# Patient Record
Sex: Male | Born: 1960 | Race: Black or African American | Hispanic: No | Marital: Single | Smoking: Never smoker
Health system: Southern US, Community
[De-identification: ages and names within clinical notes are randomized; demographics above are authoritative.]

## PROBLEM LIST (undated history)

## (undated) DIAGNOSIS — I10 Essential (primary) hypertension: Secondary | ICD-10-CM

## (undated) DIAGNOSIS — E119 Type 2 diabetes mellitus without complications: Secondary | ICD-10-CM

## (undated) DIAGNOSIS — E785 Hyperlipidemia, unspecified: Secondary | ICD-10-CM

## (undated) DIAGNOSIS — L509 Urticaria, unspecified: Secondary | ICD-10-CM

## (undated) HISTORY — DX: Urticaria, unspecified: L50.9

---

## 2012-09-30 ENCOUNTER — Emergency Department (HOSPITAL_BASED_OUTPATIENT_CLINIC_OR_DEPARTMENT_OTHER)
Admission: EM | Admit: 2012-09-30 | Discharge: 2012-10-01 | Disposition: A | Discharge status: Home / Self Care | Payer: PRIVATE HEALTH INSURANCE | Attending: Emergency Medicine | Admitting: Emergency Medicine

## 2012-09-30 ENCOUNTER — Encounter (HOSPITAL_BASED_OUTPATIENT_CLINIC_OR_DEPARTMENT_OTHER): Payer: Self-pay | Admitting: *Deleted

## 2012-09-30 DIAGNOSIS — I10 Essential (primary) hypertension: Secondary | ICD-10-CM | POA: Insufficient documentation

## 2012-09-30 DIAGNOSIS — L299 Pruritus, unspecified: Secondary | ICD-10-CM | POA: Insufficient documentation

## 2012-09-30 DIAGNOSIS — E785 Hyperlipidemia, unspecified: Secondary | ICD-10-CM | POA: Insufficient documentation

## 2012-09-30 DIAGNOSIS — L509 Urticaria, unspecified: Secondary | ICD-10-CM | POA: Insufficient documentation

## 2012-09-30 DIAGNOSIS — R21 Rash and other nonspecific skin eruption: Secondary | ICD-10-CM | POA: Insufficient documentation

## 2012-09-30 DIAGNOSIS — Z79899 Other long term (current) drug therapy: Secondary | ICD-10-CM | POA: Insufficient documentation

## 2012-09-30 DIAGNOSIS — E119 Type 2 diabetes mellitus without complications: Secondary | ICD-10-CM | POA: Insufficient documentation

## 2012-09-30 DIAGNOSIS — T7840XA Allergy, unspecified, initial encounter: Secondary | ICD-10-CM

## 2012-09-30 HISTORY — DX: Essential (primary) hypertension: I10

## 2012-09-30 HISTORY — DX: Hyperlipidemia, unspecified: E78.5

## 2012-09-30 HISTORY — DX: Type 2 diabetes mellitus without complications: E11.9

## 2012-09-30 MED ORDER — FAMOTIDINE 20 MG PO TABS: 20.0000 mg | ORAL_TABLET | Freq: Two times a day (BID) | ORAL | Status: DC

## 2012-09-30 NOTE — ED Provider Notes (Signed)
History     CSN: [id number]  Arrival date & time 09/30/12  2152   First MD Initiated Contact with Patient 09/30/12 2302      Chief Complaint  Patient presents with  . Allergic Reaction    (Consider location/radiation/quality/duration/timing/severity/associated sxs/prior treatment) Patient is a 52 y.o. male presenting with allergic reaction. The history is provided by the patient. No language interpreter was used.  Allergic Reaction The primary symptoms are  rash and urticaria. The primary symptoms do not include wheezing, shortness of breath, cough or angioedema. The current episode started more than 2 days ago (at least a year has seen an allergist and they could not find the source). The problem has not changed since onset.This is a chronic problem.  The rash is associated with itching.  The urticaria began more than 2 days ago. The urticaria has been unchanged since its onset. Urticaria is a chronic problem. Urticaria is located on the left arm and right arm. The onset of urticaria was associated with scratching of the skin.  Significant symptoms also include itching. Significant symptoms that are not present include eye redness or rhinorrhea.    Past Medical History  Diagnosis Date  . Hypertension   . Diabetes mellitus without complication   . Hyperlipidemia     History reviewed. No pertinent past surgical history.  No family history on file.  History  Substance Use Topics  . Smoking status: Never Smoker   . Smokeless tobacco: Not on file  . Alcohol Use: No      Review of Systems  HENT: Negative for facial swelling and rhinorrhea.   Eyes: Negative for redness.  Respiratory: Negative for cough, shortness of breath and wheezing.   Skin: Positive for itching and rash.  All other systems reviewed and are negative.    Allergies  Peanuts  Home Medications   Current Outpatient Rx  Name  Route  Sig  Dispense  Refill  . AMLODIPINE BESYLATE 10 MG PO TABS   Oral    Take 10 mg by mouth daily.         . ATORVASTATIN CALCIUM 40 MG PO TABS   Oral   Take 40 mg by mouth daily.         . FEBUXOSTAT 40 MG PO TABS   Oral   Take 40 mg by mouth daily.         Marland Kitchen GLIPIZIDE-METFORMIN HCL 2.5-250 MG PO TABS   Oral   Take 2 tablets by mouth 2 (two) times daily before a meal.         . HYDROXYZINE PAMOATE 25 MG PO CAPS   Oral   Take 25 mg by mouth 4 (four) times daily as needed.         Marland Kitchen LOSARTAN POTASSIUM 100 MG PO TABS   Oral   Take 100 mg by mouth daily.         Marland Kitchen TADALAFIL 5 MG PO TABS   Oral   Take 5 mg by mouth daily as needed.         Marland Kitchen FAMOTIDINE 20 MG PO TABS   Oral   Take 1 tablet (20 mg total) by mouth 2 (two) times daily.   30 tablet   0     BP 139/80  Pulse 95  Temp 98.4 F (36.9 C) (Oral)  Resp 18  Ht 6\' 1"  (1.854 m)  Wt 325 lb (147.419 kg)  BMI 42.88 kg/m2  SpO2 100%  Physical Exam  Constitutional:  He is oriented to person, place, and time. He appears well-developed and well-nourished. No distress.  HENT:  Head: Normocephalic and atraumatic.  Mouth/Throat: Oropharynx is clear and moist.       No swelling of the lips tongue or uvula  Eyes: Conjunctivae normal are normal. Pupils are equal, round, and reactive to light.  Neck: Normal range of motion. Neck supple.  Cardiovascular: Normal rate, regular rhythm and intact distal pulses.   Pulmonary/Chest: Effort normal and breath sounds normal. No stridor. He has no wheezes. He has no rales.  Abdominal: Soft. Bowel sounds are normal. There is no tenderness. There is no rebound and no guarding.  Musculoskeletal: Normal range of motion.  Neurological: He is alert and oriented to person, place, and time.  Skin: Skin is warm and dry. Rash noted.       B forearms urticaria  Psychiatric: Thought content normal.    ED Course  Procedures (including critical care time)  Labs Reviewed - No data to display No results found.   1. Allergic reaction       MDM   Patient states he has steroids at home and states anti histamines do not work.  Refuses all interventions in the ED with nurse Windell Moulding present.  EDP spent 10 minutes or more in counseling of patient with nurse Windell Moulding present.  Patient states he will follow up with his doctor he has decision making capacity to refuse care       Sicily Zaragoza K Alexandrea Westergard-Rasch, MD 09/30/12 2330

## 2012-09-30 NOTE — ED Notes (Signed)
Went to room with MD to discuss tx options in the ED.  Pt declined offer of all available medications given in the ED. Pt also refused the prescriptions offered by MD to get him through until he can follow up with his pmd. Pt stated that he could not take Prednisone any more due to his HTN and DM.  Stated that the other meds do not work. MD was courteous to pt and made offers of tx multiple times and multiple routes and pt just shook his head and declined.  York Spaniel he would just go home.

## 2012-09-30 NOTE — ED Notes (Signed)
Pt left room before receiving DC instructions and rx.  Pt could not be found in ED.

## 2012-09-30 NOTE — ED Notes (Signed)
Pt sts he has had a rash x2 weeks from peanuts. Pt completed course of prednisone x2 and rash returns as soon as he finishes them. Pt sts today he was dizzy.

## 2019-02-14 ENCOUNTER — Inpatient Hospital Stay (HOSPITAL_BASED_OUTPATIENT_CLINIC_OR_DEPARTMENT_OTHER)
Admission: EM | Admit: 2019-02-14 | Discharge: 2019-02-21 | DRG: 177 | Disposition: A | Discharge status: Home / Self Care | Payer: Commercial Managed Care - PPO | Attending: Internal Medicine | Admitting: Internal Medicine

## 2019-02-14 ENCOUNTER — Encounter (HOSPITAL_BASED_OUTPATIENT_CLINIC_OR_DEPARTMENT_OTHER): Payer: Self-pay | Admitting: Emergency Medicine

## 2019-02-14 ENCOUNTER — Other Ambulatory Visit: Payer: Self-pay

## 2019-02-14 ENCOUNTER — Emergency Department (HOSPITAL_BASED_OUTPATIENT_CLINIC_OR_DEPARTMENT_OTHER): Payer: Commercial Managed Care - PPO

## 2019-02-14 DIAGNOSIS — R630 Anorexia: Secondary | ICD-10-CM | POA: Diagnosis present

## 2019-02-14 DIAGNOSIS — Z6841 Body Mass Index (BMI) 40.0 and over, adult: Secondary | ICD-10-CM | POA: Diagnosis not present

## 2019-02-14 DIAGNOSIS — N179 Acute kidney failure, unspecified: Secondary | ICD-10-CM | POA: Diagnosis present

## 2019-02-14 DIAGNOSIS — L501 Idiopathic urticaria: Secondary | ICD-10-CM | POA: Diagnosis present

## 2019-02-14 DIAGNOSIS — Z79899 Other long term (current) drug therapy: Secondary | ICD-10-CM

## 2019-02-14 DIAGNOSIS — U071 COVID-19: Principal | ICD-10-CM | POA: Diagnosis present

## 2019-02-14 DIAGNOSIS — R300 Dysuria: Secondary | ICD-10-CM | POA: Diagnosis present

## 2019-02-14 DIAGNOSIS — E119 Type 2 diabetes mellitus without complications: Secondary | ICD-10-CM | POA: Diagnosis present

## 2019-02-14 DIAGNOSIS — E861 Hypovolemia: Secondary | ICD-10-CM | POA: Diagnosis present

## 2019-02-14 DIAGNOSIS — I10 Essential (primary) hypertension: Secondary | ICD-10-CM | POA: Diagnosis not present

## 2019-02-14 DIAGNOSIS — Z91010 Allergy to peanuts: Secondary | ICD-10-CM

## 2019-02-14 DIAGNOSIS — K529 Noninfective gastroenteritis and colitis, unspecified: Secondary | ICD-10-CM | POA: Diagnosis present

## 2019-02-14 DIAGNOSIS — J1289 Other viral pneumonia: Secondary | ICD-10-CM | POA: Diagnosis present

## 2019-02-14 DIAGNOSIS — E785 Hyperlipidemia, unspecified: Secondary | ICD-10-CM | POA: Diagnosis present

## 2019-02-14 DIAGNOSIS — A0839 Other viral enteritis: Secondary | ICD-10-CM | POA: Diagnosis present

## 2019-02-14 DIAGNOSIS — I959 Hypotension, unspecified: Secondary | ICD-10-CM | POA: Diagnosis present

## 2019-02-14 DIAGNOSIS — K219 Gastro-esophageal reflux disease without esophagitis: Secondary | ICD-10-CM | POA: Diagnosis present

## 2019-02-14 DIAGNOSIS — E860 Dehydration: Secondary | ICD-10-CM | POA: Diagnosis present

## 2019-02-14 DIAGNOSIS — E871 Hypo-osmolality and hyponatremia: Secondary | ICD-10-CM | POA: Diagnosis present

## 2019-02-14 DIAGNOSIS — A419 Sepsis, unspecified organism: Secondary | ICD-10-CM | POA: Diagnosis present

## 2019-02-14 DIAGNOSIS — J9601 Acute respiratory failure with hypoxia: Secondary | ICD-10-CM | POA: Diagnosis present

## 2019-02-14 DIAGNOSIS — R579 Shock, unspecified: Secondary | ICD-10-CM

## 2019-02-14 LAB — GLUCOSE, CAPILLARY
Glucose-Capillary: 158 mg/dL — ABNORMAL HIGH (ref 70–99)
Glucose-Capillary: 164 mg/dL — ABNORMAL HIGH (ref 70–99)

## 2019-02-14 LAB — CBC WITH DIFFERENTIAL/PLATELET
Abs Immature Granulocytes: 0.14 10*3/uL — ABNORMAL HIGH (ref 0.00–0.07)
Basophils Absolute: 0 10*3/uL (ref 0.0–0.1)
Basophils Relative: 0 %
Eosinophils Absolute: 0 10*3/uL (ref 0.0–0.5)
Eosinophils Relative: 0 %
HCT: 45 % (ref 39.0–52.0)
Hemoglobin: 13.9 g/dL (ref 13.0–17.0)
Immature Granulocytes: 1 %
Lymphocytes Relative: 4 %
Lymphs Abs: 0.5 10*3/uL — ABNORMAL LOW (ref 0.7–4.0)
MCH: 26.3 pg (ref 26.0–34.0)
MCHC: 30.9 g/dL (ref 30.0–36.0)
MCV: 85.1 fL (ref 80.0–100.0)
Monocytes Absolute: 1.1 10*3/uL — ABNORMAL HIGH (ref 0.1–1.0)
Monocytes Relative: 8 %
Neutro Abs: 12.4 10*3/uL — ABNORMAL HIGH (ref 1.7–7.7)
Neutrophils Relative %: 87 %
Platelets: 205 10*3/uL (ref 150–400)
RBC: 5.29 MIL/uL (ref 4.22–5.81)
RDW: 16.1 % — ABNORMAL HIGH (ref 11.5–15.5)
WBC: 14.2 10*3/uL — ABNORMAL HIGH (ref 4.0–10.5)
nRBC: 0 % (ref 0.0–0.2)

## 2019-02-14 LAB — BASIC METABOLIC PANEL
Anion gap: 12 (ref 5–15)
BUN: 27 mg/dL — ABNORMAL HIGH (ref 6–20)
CO2: 20 mmol/L — ABNORMAL LOW (ref 22–32)
Calcium: 9 mg/dL (ref 8.9–10.3)
Chloride: 101 mmol/L (ref 98–111)
Creatinine, Ser: 2.49 mg/dL — ABNORMAL HIGH (ref 0.61–1.24)
GFR calc Af Amer: 32 mL/min — ABNORMAL LOW (ref >60)
GFR calc non Af Amer: 27 mL/min — ABNORMAL LOW (ref >60)
Glucose, Bld: 237 mg/dL — ABNORMAL HIGH (ref 70–99)
Potassium: 4.2 mmol/L (ref 3.5–5.1)
Sodium: 133 mmol/L — ABNORMAL LOW (ref 135–145)

## 2019-02-14 LAB — LACTIC ACID, PLASMA: Lactic Acid, Venous: 2.8 mmol/L (ref 0.5–1.9)

## 2019-02-14 LAB — URINALYSIS, ROUTINE W REFLEX MICROSCOPIC
Bilirubin Urine: NEGATIVE
Glucose, UA: NEGATIVE mg/dL
Hgb urine dipstick: NEGATIVE
Ketones, ur: NEGATIVE mg/dL
Leukocytes,Ua: NEGATIVE
Nitrite: NEGATIVE
Protein, ur: NEGATIVE mg/dL
Specific Gravity, Urine: <1.005 — ABNORMAL LOW (ref 1.005–1.030)
pH: 5.5 (ref 5.0–8.0)

## 2019-02-14 LAB — SARS CORONAVIRUS 2 AG (30 MIN TAT): SARS Coronavirus 2 Ag: POSITIVE — AB

## 2019-02-14 MED ORDER — ONDANSETRON HCL 4 MG PO TABS
4.0000 mg | ORAL_TABLET | Freq: Four times a day (QID) | ORAL | Status: DC | PRN
  Administered 2019-02-16: 21:00:00 4 mg via ORAL
  Filled 2019-02-14: qty 1

## 2019-02-14 MED ORDER — LOPERAMIDE HCL 2 MG PO CAPS
2.0000 mg | ORAL_CAPSULE | ORAL | Status: DC | PRN
  Administered 2019-02-16: 2 mg via ORAL
  Filled 2019-02-14: qty 1

## 2019-02-14 MED ORDER — SODIUM CHLORIDE 0.9 % IV BOLUS
3000.0000 mL | Freq: Once | INTRAVENOUS | Status: AC
  Administered 2019-02-14: 3000 mL via INTRAVENOUS

## 2019-02-14 MED ORDER — SODIUM CHLORIDE 0.9 % IV SOLN
2.0000 g | Freq: Once | INTRAVENOUS | Status: AC
  Administered 2019-02-14: 2 g via INTRAVENOUS
  Filled 2019-02-14: qty 2

## 2019-02-14 MED ORDER — ONDANSETRON HCL 4 MG/2ML IJ SOLN: 4.0000 mg | Freq: Four times a day (QID) | INTRAMUSCULAR | Status: DC | PRN

## 2019-02-14 MED ORDER — HEPARIN SODIUM (PORCINE) 5000 UNIT/ML IJ SOLN
5000.0000 [IU] | Freq: Three times a day (TID) | INTRAMUSCULAR | Status: DC
  Administered 2019-02-14 – 2019-02-15 (×4): 5000 [IU] via SUBCUTANEOUS
  Filled 2019-02-14 (×4): qty 1

## 2019-02-14 MED ORDER — VANCOMYCIN HCL IN DEXTROSE 1-5 GM/200ML-% IV SOLN
1000.0000 mg | INTRAVENOUS | Status: AC
  Administered 2019-02-14 (×2): 1000 mg via INTRAVENOUS
  Filled 2019-02-14: qty 200

## 2019-02-14 MED ORDER — LACTATED RINGERS IV BOLUS
1000.0000 mL | Freq: Once | INTRAVENOUS | Status: AC
  Administered 2019-02-14: 1000 mL via INTRAVENOUS

## 2019-02-14 MED ORDER — HYDROXYZINE PAMOATE 25 MG PO CAPS
25.0000 mg | ORAL_CAPSULE | ORAL | Status: DC | PRN
  Filled 2019-02-14: qty 1

## 2019-02-14 MED ORDER — TRAMADOL HCL 50 MG PO TABS
50.0000 mg | ORAL_TABLET | Freq: Two times a day (BID) | ORAL | Status: DC | PRN
  Administered 2019-02-15 – 2019-02-16 (×3): 50 mg via ORAL
  Filled 2019-02-14 (×3): qty 1

## 2019-02-14 MED ORDER — FAMOTIDINE 20 MG PO TABS
20.0000 mg | ORAL_TABLET | Freq: Two times a day (BID) | ORAL | Status: DC
  Administered 2019-02-14 – 2019-02-21 (×14): 20 mg via ORAL
  Filled 2019-02-14 (×15): qty 1

## 2019-02-14 MED ORDER — INSULIN ASPART 100 UNIT/ML ~~LOC~~ SOLN
0.0000 [IU] | Freq: Three times a day (TID) | SUBCUTANEOUS | Status: DC
  Administered 2019-02-15: 12:00:00 5 [IU] via SUBCUTANEOUS
  Administered 2019-02-15: 3 [IU] via SUBCUTANEOUS

## 2019-02-14 MED ORDER — VANCOMYCIN HCL IN DEXTROSE 1-5 GM/200ML-% IV SOLN
INTRAVENOUS | Status: AC
  Administered 2019-02-14: 1000 mg via INTRAVENOUS
  Filled 2019-02-14: qty 200

## 2019-02-14 MED ORDER — SODIUM CHLORIDE 0.9 % IV SOLN
2000.0000 mg | Freq: Once | INTRAVENOUS | Status: DC
  Filled 2019-02-14: qty 2000

## 2019-02-14 MED ORDER — BISACODYL 10 MG RE SUPP: 10.0000 mg | Freq: Every day | RECTAL | Status: DC | PRN

## 2019-02-14 MED ORDER — SENNOSIDES-DOCUSATE SODIUM 8.6-50 MG PO TABS: 1.0000 | ORAL_TABLET | Freq: Every evening | ORAL | Status: DC | PRN

## 2019-02-14 MED ORDER — ACETAMINOPHEN 325 MG PO TABS
650.0000 mg | ORAL_TABLET | Freq: Four times a day (QID) | ORAL | Status: DC | PRN
  Administered 2019-02-15 – 2019-02-20 (×5): 650 mg via ORAL
  Filled 2019-02-14 (×5): qty 2

## 2019-02-14 MED ORDER — SODIUM CHLORIDE 0.9 % IV SOLN
INTRAVENOUS | Status: DC
  Administered 2019-02-14 – 2019-02-19 (×5): via INTRAVENOUS

## 2019-02-14 MED ORDER — CEFEPIME HCL 2 G IJ SOLR
INTRAMUSCULAR | Status: AC
  Filled 2019-02-14: qty 2

## 2019-02-14 MED ORDER — INSULIN ASPART 100 UNIT/ML ~~LOC~~ SOLN: 0.0000 [IU] | Freq: Every day | SUBCUTANEOUS | Status: DC

## 2019-02-14 NOTE — ED Provider Notes (Signed)
[facility] EMERGENCY DEPARTMENT Provider Note   CSN: [id number] Arrival date & time: 02/14/19  [id number]     History   Chief Complaint Chief Complaint  Patient presents with  . Fever    HPI Grant Rutshurman Oesterle is a 58 y.o. male.  HPI   57yM with fever and body aches. Onset Saturday. Constant and progressive since then. Feels very fatigued. Body aches. Urinating frequently and some dysuria. Not really having any abdominal pain. Nausea. No v/d. Occasional cough but doesn't feel SOB.   Past Medical History:  Diagnosis Date  . Diabetes mellitus without complication (HCC)   . Hyperlipidemia   . Hypertension     There are no active problems to display for this patient.   History reviewed. No pertinent surgical history.      Home Medications    Prior to Admission medications   Medication Sig Start Date End Date Taking? Authorizing Provider  amLODipine (NORVASC) 10 MG tablet Take 10 mg by mouth daily.    [provider]  atorvastatin (LIPITOR) 40 MG tablet Take 40 mg by mouth daily.    [provider]  famotidine (PEPCID) 20 MG tablet Take 1 tablet (20 mg total) by mouth 2 (two) times daily. 09/30/12   Palumbo, April, MD  febuxostat (ULORIC) 40 MG tablet Take 40 mg by mouth daily.    [provider]  glipizide-metformin (METAGLIP) 2.5-250 MG per tablet Take 2 tablets by mouth 2 (two) times daily before a meal.    [provider]  hydrOXYzine (VISTARIL) 25 MG capsule Take 25 mg by mouth 4 (four) times daily as needed.    [provider]  losartan (COZAAR) 100 MG tablet Take 100 mg by mouth daily.    [provider]  tadalafil (CIALIS) 5 MG tablet Take 5 mg by mouth daily as needed.    [provider]    Family History History reviewed. No pertinent family history.  Social History Social History   Tobacco Use  . Smoking status: Never Smoker  . Smokeless tobacco: Never Used  Substance Use Topics  . Alcohol  use: No  . Drug use: Not on file     Allergies   Peanuts [peanut oil]   Review of Systems Review of Systems All systems reviewed and negative, other than as noted in HPI.   Physical Exam Updated Vital Signs BP 119/88 (BP Location: Right Arm)   Pulse 98   Temp 99.9 F (37.7 C) (Oral)   Resp 20   Ht 6\' 1"  (1.854 m)   Wt (!) 154.2 kg   SpO2 94%   BMI 44.86 kg/m   Physical Exam Vitals signs and nursing note reviewed.  Constitutional:      General: He is not in acute distress.    Appearance: He is well-developed. He is obese.     Comments: Laying in bed. Appears tired but not toxic/distressed.   HENT:     Head: Normocephalic and atraumatic.  Eyes:     General:        Right eye: No discharge.        Left eye: No discharge.     Conjunctiva/sclera: Conjunctivae normal.  Neck:     Musculoskeletal: Neck supple.  Cardiovascular:     Rate and Rhythm: Normal rate and regular rhythm.     Heart sounds: Normal heart sounds. No murmur. No friction rub. No gallop.   Pulmonary:     Effort: Pulmonary effort is normal. No respiratory distress.  Breath sounds: Normal breath sounds.  Abdominal:     General: There is no distension.     Palpations: Abdomen is soft.     Tenderness: There is no abdominal tenderness.  Musculoskeletal:        General: No tenderness.  Skin:    General: Skin is warm and dry.  Neurological:     General: No focal deficit present.     Mental Status: He is alert and oriented to person, place, and time.  Psychiatric:        Behavior: Behavior normal.        Thought Content: Thought content normal.      ED Treatments / Results  Labs (all labs ordered are listed, but only abnormal results are displayed) Labs Reviewed  SARS CORONAVIRUS 2 (HOSP ORDER, PERFORMED IN [facility] VIA [facility] ID) - Abnormal; Notable for the following components:      Result Value   SARS Coronavirus 2 ([facility] ID Now) POSITIVE (*)    All other components within normal  limits  CBC WITH DIFFERENTIAL/PLATELET - Abnormal; Notable for the following components:   WBC 14.2 (*)    RDW 16.1 (*)    Neutro Abs 12.4 (*)    Lymphs Abs 0.5 (*)    Monocytes Absolute 1.1 (*)    Abs Immature Granulocytes 0.14 (*)    All other components within normal limits  BASIC METABOLIC PANEL - Abnormal; Notable for the following components:   Sodium 133 (*)    CO2 20 (*)    Glucose, Bld 237 (*)    BUN 27 (*)    Creatinine, Ser 2.49 (*)    GFR calc non Af Amer 27 (*)    GFR calc Af Amer 32 (*)    All other components within normal limits  LACTIC ACID, PLASMA - Abnormal; Notable for the following components:   Lactic Acid, Venous 2.8 (*)    All other components within normal limits  NOVEL CORONAVIRUS, NAA (HOSPITAL ORDER, SEND-OUT TO REF LAB)  CULTURE, BLOOD (ROUTINE X 2)  CULTURE, BLOOD (ROUTINE X 2)  URINALYSIS, ROUTINE W REFLEX MICROSCOPIC  LACTIC ACID, PLASMA    EKG EKG Interpretation  Date/Time:  Monday February 14 2019 13:52:12 EDT Ventricular Rate:  85 PR Interval:    QRS Duration: 124 QT Interval:  368 QTC Calculation: 438 R Axis:   -70 Text Interpretation:  Sinus rhythm Nonspecific IVCD with LAD Left ventricular hypertrophy Reconfirmed by Raeford RazorKohut, Kirstine Jacquin [phone]([id number]) on 02/14/2019 2:27:29 PM   Radiology Dg Chest Portable 1 View  Result Date: 02/14/2019 CLINICAL DATA:  Cough, weakness for 2 days EXAM: PORTABLE CHEST 1 VIEW COMPARISON:  None. FINDINGS: The heart size and mediastinal contours are within normal limits. Both lungs are clear. The visualized skeletal structures are unremarkable. IMPRESSION: No active disease. Electronically Signed   By: Elige KoHetal  Patel   On: 02/14/2019 10:50    Procedures Procedures (including critical care time)  CRITICAL CARE Performed by: Raeford RazorStephen Jullian Previti Total critical care time: 35 minutes Critical care time was exclusive of separately billable procedures and treating other patients. Critical care was necessary to treat or prevent  imminent or life-threatening deterioration. Critical care was time spent personally by me on the following activities: development of treatment plan with patient and/or surrogate as well as nursing, discussions with consultants, evaluation of patient's response to treatment, examination of patient, obtaining history from patient or surrogate, ordering and performing treatments and interventions, ordering and review of laboratory studies, ordering and review  of radiographic studies, pulse oximetry and re-evaluation of patient's condition.   Medications Ordered in ED Medications  vancomycin (VANCOCIN) IVPB 1000 mg/200 mL premix (1,000 mg Intravenous New Bag/Given 02/14/19 1448)  lactated ringers bolus 1,000 mL (0 mLs Intravenous Stopped 02/14/19 1420)  sodium chloride 0.9 % bolus 3,000 mL (3,000 mLs Intravenous New Bag/Given 02/14/19 1342)  ceFEPIme (MAXIPIME) 2 g in sodium chloride 0.9 % 100 mL IVPB (0 g Intravenous Stopped 02/14/19 1447)  ceFEPIme (MAXIPIME) 2 g injection (  Return to [facility] 02/14/19 1348)     Initial Impression / Assessment and Plan / ED Course  I have reviewed the triage vital signs and the nursing notes.  Pertinent labs & imaging results that were available during my care of the patient were reviewed by me and considered in my medical decision making (see chart for details).   57yM with sepsis/COVID. Presented normotensive but subsequently dropped BP. Bolused IVF and empiric abx ordered.  COVID subsequently came back positive. He has minimal respiratory complaints. CXR clear. 02 sats normal on RA.    Tameem Leuthold was evaluated in Emergency Department on 02/14/2019 for the symptoms described in the history of present illness. He was evaluated in the context of the global COVID-19 pandemic, which necessitated consideration that the patient might be at risk for infection with the SARS-CoV-2 virus that causes COVID-19. Institutional protocols and algorithms that pertain to the  evaluation of patients at risk for COVID-19 are in a state of rapid change based on information released by regulatory bodies including the CDC and federal and state organizations. These policies and algorithms were followed during the patient's care in the ED.   Final Clinical Impressions(s) / ED Diagnoses   Final diagnoses:  COVID-19 virus infection  Sepsis, due to unspecified organism, unspecified whether acute organ dysfunction present [facility])    ED Discharge Orders    None       Virgel Manifold, MD 02/14/19 1525

## 2019-02-14 NOTE — ED Triage Notes (Signed)
Pt reports cough, fever, nausea, dizzyness, and fatigue x Saturday. Several coworkers at his job have tested + for covid.

## 2019-02-14 NOTE — H&P (Signed)
History and Physical    Marcus Fritz Boeding ZOX:[id number] DOB: [birth year] DOA: 02/14/2019  [facility] [place]   [facility]  Chief Complaint: fatigue / myalgia  HPI: 58yo w/ a hx of DM, HTN, and Obesity who [facility] w/ c/o 2-3 days of generalized fatigue, subjective fevers, diarrhea, and myalgia.   In the ED he was found to be hypotensive into the 60s systolic, and suffering w/ acute renal failure w/ a crt of 2.49 (baseline 1.0 earlier this month). His BP improved w/ volume, but he remained very weak. He was also incidentally noted to be SARS-CoV-2+, though he denies SOB, cough, abdom pain, or diarrhea.    Assessment/Plan  COVID Gastroenteritis  No evidence of pulmonary issues presently - watch carefully as pt is hydrated - repeat CXR in AM - hydrate - imodium - encourage diet   Hypotension - Severe Dehydration  Due to fever and GI losses - hydrate and follow - responding well already   Acute renal failure  Likely simple pre-renal azotemia worsened by ongoing use of ARB - stop ARB - hydrate - f/u in AM  Hyponatremia  Due to hypovolemia - follow w/ volume expansion   HTN Not an active issue presently due [facility]  HLD Hold Lipitor until rehydrated and intake improved   DM2 A1c 7.8 11/08/18  Chronic Intermittent Idiopathic Urticaria Followed in outpt setting - has seen a Dermatologist w/ no etiology found   Morbid Obesity - Body mass index is 44.86 kg/m.   DVT prophylaxis: SQ heparin  Code Status: FULL Family Communication: none at point of admit   [facility]   Review of Systems: As per HPI otherwise 10 point review of systems negative.   Past Medical History:  Diagnosis Date  . Diabetes mellitus without complication (HCC)   . Hyperlipidemia   . Hypertension     History reviewed. No pertinent surgical history.  Family History  History reviewed. No pertinent family history.   Social History   reports that he has never smoked. He has never used smokeless tobacco. He reports that he does not drink alcohol. No history on file for drug.  Allergies Allergies  Allergen Reactions  . Peanuts [Peanut Oil] Rash    Prior to Admission medications   Medication Sig Start Date End Date Taking? Authorizing Provider  amLODipine (NORVASC) 10 MG tablet Take 10 mg by mouth daily.    [provider]  atorvastatin (LIPITOR) 40 MG tablet Take 40 mg by mouth daily.    [provider]  famotidine (PEPCID) 20 MG tablet Take 1 tablet (20 mg total) by mouth 2 (two) times daily. 09/30/12   Palumbo, April, MD  febuxostat (ULORIC) 40 MG tablet Take 40 mg by mouth daily.    [provider]  glipizide-metformin (METAGLIP) 2.5-250 MG per tablet Take 2 tablets by mouth 2 (two) times daily before a meal.    [provider]  hydrOXYzine (VISTARIL) 25 MG capsule Take 25 mg by mouth 4 (four) times daily as needed.    [provider]  losartan (COZAAR) 100 MG tablet Take 100 mg by mouth daily.    [provider]  tadalafil (CIALIS) 5 MG tablet Take 5 mg by mouth daily as needed.    [provider]    Physical Exam: Vitals:   02/14/19 1445 02/14/19 1500 02/14/19 1533 02/14/19 1615  BP: (!) 110/58 122/75 99/86 (!) 118/58  Pulse: 83 81 (!) 38 89  Resp: (!) 23 (!) 24 16 (!) 24  Temp:      TempSrc:      SpO2: 97% 96% 95% 98%  Weight:      Height:        Constitutional: NAD, calm, comfortable ENMT: Mucous membranes are moist. Posterior pharynx clear of any exudate or lesions.Normal dentition.  Neck: normal, supple, no masses, no thyromegaly Respiratory: clear to auscultation bilaterally, no wheezing, no crackles. Normal respiratory effort. No accessory muscle use.  Cardiovascular: Regular rate and rhythm, no murmurs / rubs / gallops. No extremity edema. 2+ pedal pulses. No carotid bruits.  Abdomen: no tenderness, obese, no masses  palpated. Bowel sounds positive.  Musculoskeletal: no clubbing / cyanosis. No joint deformity upper and lower extremities. Good ROM, no contractures. Normal muscle tone.  Neurologic: CN 2-12 grossly intact. Sensation intact, DTR normal. Strength 5/5 in all 4.  Psychiatric: Normal judgment and insight. Alert and oriented x 3. Normal mood.    Labs on Admission:   CBC: Recent Labs  Lab 02/14/19 1323  WBC 14.2*  NEUTROABS 12.4*  HGB 13.9  HCT 45.0  MCV 85.1  PLT 205   Basic Metabolic Panel: Recent Labs  Lab 02/14/19 1323  NA 133*  K 4.2  CL 101  CO2 20*  GLUCOSE 237*  BUN 27*  CREATININE 2.49*  CALCIUM 9.0   GFR: Estimated Creatinine Clearance: 50.1 mL/min (A) (by C-G formula based on SCr of 2.49 mg/dL (H)).  Urine analysis:    Component Value Date/Time   COLORURINE YELLOW 02/14/2019 1540   APPEARANCEUR CLEAR 02/14/2019 1540   LABSPEC <1.005 (L) 02/14/2019 1540   PHURINE 5.5 02/14/2019 1540   GLUCOSEU NEGATIVE 02/14/2019 1540   HGBUR NEGATIVE 02/14/2019 1540   BILIRUBINUR NEGATIVE 02/14/2019 1540   KETONESUR NEGATIVE 02/14/2019 1540   PROTEINUR NEGATIVE 02/14/2019 1540   NITRITE NEGATIVE 02/14/2019 1540   LEUKOCYTESUR NEGATIVE 02/14/2019 1540    Recent Results (from the past 240 hour(s))  SARS Coronavirus 2 (Hosp order,[facility] via [facility] ID)     Status: Abnormal   Collection Time: 02/14/19  1:53 PM   Specimen: Dry Nasal Swab ([facility] ID Now)  Result Value Ref Range Status   SARS Coronavirus 2 ([facility]bbott ID Now) POSITIVE (A) NEGATIVE Final    Comment: RESULT CALLED TO, READ BACK BY AND VERIFIED WITH: AMY HARTLEY RN @1416  02/14/2019 OLSONM (NOTE) Interpretive Result Comment(s): COVID 19 Positive SARS CoV 2 target nucleic acids are DETECTED. The SARS CoV 2 RNA is generally detectable in upper and lower respiratory specimens during the acute phase of infection.  Positive results are indicative of active infection with SARS CoV 2.  Clinical  correlation with patient history and other diagnostic information is necessary to determine patient infection status.  Positive results do not rule out bacterial infection or coinfection with other viruses. The expected result is Negative. COVID 19 Negative SARS CoV 2 target nucleic acids are NOT DETECTED. The SARS CoV 2 RNA is generally detectable in upper and lower respiratory specimens during the acute phase of infection.  Negative results do not preclude SARS CoV 2 infection, do not rule out coinfections with other pathogens, and should not be used as the sole basis for treatme nt or other patient management decisions.  Negative results must be combined with clinical observations, patient history, and epidemiological information. The expected result is Negative. Invalid Presence or absence of SARS CoV 2 nucleic acids cannot be determined.  Repeat testing was performed on the submitted specimen and repeated Invalid results were obtained.  If clinically indicated, additional testing on a new specimen with an alternate test methodology [phone]) is advised.  The SARS CoV 2 RNA is generally detectable in upper and lower respiratory specimens during the acute phase of infection. The expected result is Negative. Fact Sheet for Patients:  GolfingFamily.no Fact Sheet for Healthcare Providers: https://www.hernandez-brewer.com/ This test is not yet approved or cleared by the [place] FDA and has been authorized for detection and/or diagnosis of SARS CoV 2 by FDA under an Emergency Use Authorization (EUA).  This EU A will remain in effect (meaning this test can be used) for the duration of the COVID19 declaration under Section 564(b)(1) of the Act, 21 U.S.C. section [phone] 3(b)(1), unless the authorization is terminated or revoked sooner. Performed at [facility], [address] [place], [place] [address]      Radiological Exams on  Admission: Dg Chest Portable 1 View  Result Date: 02/14/2019 CLINICAL DATA:  Cough, weakness for 2 days EXAM: PORTABLE CHEST 1 VIEW COMPARISON:  None. FINDINGS: The heart size and mediastinal contours are within normal limits. Both lungs are clear. The visualized skeletal structures are unremarkable. IMPRESSION: No active disease. Electronically Signed   By: Kathreen Devoid   On: 02/14/2019 10:50    Cherene Altes, MD [facility] Office  [phone]  02/14/2019, 4:53 PM

## 2019-02-15 ENCOUNTER — Inpatient Hospital Stay (HOSPITAL_COMMUNITY): Payer: Commercial Managed Care - PPO

## 2019-02-15 DIAGNOSIS — J1289 Other viral pneumonia: Secondary | ICD-10-CM

## 2019-02-15 LAB — CBC WITH DIFFERENTIAL/PLATELET
Abs Immature Granulocytes: 0.05 10*3/uL (ref 0.00–0.07)
Basophils Absolute: 0 10*3/uL (ref 0.0–0.1)
Basophils Relative: 0 %
Eosinophils Absolute: 0 10*3/uL (ref 0.0–0.5)
Eosinophils Relative: 0 %
HCT: 44.6 % (ref 39.0–52.0)
Hemoglobin: 13.5 g/dL (ref 13.0–17.0)
Immature Granulocytes: 1 %
Lymphocytes Relative: 7 %
Lymphs Abs: 0.7 10*3/uL (ref 0.7–4.0)
MCH: 26.1 pg (ref 26.0–34.0)
MCHC: 30.3 g/dL (ref 30.0–36.0)
MCV: 86.1 fL (ref 80.0–100.0)
Monocytes Absolute: 0.4 10*3/uL (ref 0.1–1.0)
Monocytes Relative: 5 %
Neutro Abs: 7.6 10*3/uL (ref 1.7–7.7)
Neutrophils Relative %: 87 %
Platelets: 210 10*3/uL (ref 150–400)
RBC: 5.18 MIL/uL (ref 4.22–5.81)
RDW: 16.2 % — ABNORMAL HIGH (ref 11.5–15.5)
WBC: 8.8 10*3/uL (ref 4.0–10.5)
nRBC: 0 % (ref 0.0–0.2)

## 2019-02-15 LAB — COMPREHENSIVE METABOLIC PANEL
ALT: 30 U/L (ref 0–44)
AST: 21 U/L (ref 15–41)
Albumin: 3.7 g/dL (ref 3.5–5.0)
Alkaline Phosphatase: 34 U/L — ABNORMAL LOW (ref 38–126)
Anion gap: 13 (ref 5–15)
BUN: 18 mg/dL (ref 6–20)
CO2: 24 mmol/L (ref 22–32)
Calcium: 9.1 mg/dL (ref 8.9–10.3)
Chloride: 101 mmol/L (ref 98–111)
Creatinine, Ser: 1.1 mg/dL (ref 0.61–1.24)
GFR calc Af Amer: >60 mL/min (ref >60)
GFR calc non Af Amer: >60 mL/min (ref >60)
Glucose, Bld: 164 mg/dL — ABNORMAL HIGH (ref 70–99)
Potassium: 3.5 mmol/L (ref 3.5–5.1)
Sodium: 138 mmol/L (ref 135–145)
Total Bilirubin: 0.5 mg/dL (ref 0.3–1.2)
Total Protein: 7 g/dL (ref 6.5–8.1)

## 2019-02-15 LAB — FERRITIN: Ferritin: 263 ng/mL (ref 24–336)

## 2019-02-15 LAB — GLUCOSE, CAPILLARY
Glucose-Capillary: 165 mg/dL — ABNORMAL HIGH (ref 70–99)
Glucose-Capillary: 209 mg/dL — ABNORMAL HIGH (ref 70–99)
Glucose-Capillary: 192 mg/dL — ABNORMAL HIGH (ref 70–99)
Glucose-Capillary: 141 mg/dL — ABNORMAL HIGH (ref 70–99)

## 2019-02-15 LAB — C-REACTIVE PROTEIN: CRP: 16.6 mg/dL — ABNORMAL HIGH (ref <1.0)

## 2019-02-15 LAB — D-DIMER, QUANTITATIVE: D-Dimer, Quant: 0.64 ug/mL-FEU — ABNORMAL HIGH (ref 0.00–0.50)

## 2019-02-15 MED ORDER — ALBUTEROL SULFATE HFA 108 (90 BASE) MCG/ACT IN AERS
1.0000 | INHALATION_SPRAY | RESPIRATORY_TRACT | Status: DC | PRN
  Filled 2019-02-15: qty 6.7

## 2019-02-15 MED ORDER — INSULIN ASPART 100 UNIT/ML ~~LOC~~ SOLN
0.0000 [IU] | Freq: Three times a day (TID) | SUBCUTANEOUS | Status: DC
  Administered 2019-02-15 – 2019-02-16 (×3): 4 [IU] via SUBCUTANEOUS
  Administered 2019-02-16: 7 [IU] via SUBCUTANEOUS
  Administered 2019-02-17 (×2): 11 [IU] via SUBCUTANEOUS
  Administered 2019-02-17: 7 [IU] via SUBCUTANEOUS
  Administered 2019-02-18: 15 [IU] via SUBCUTANEOUS
  Administered 2019-02-18: 11 [IU] via SUBCUTANEOUS
  Administered 2019-02-18: 15 [IU] via SUBCUTANEOUS
  Administered 2019-02-19 (×2): 20 [IU] via SUBCUTANEOUS
  Administered 2019-02-19 – 2019-02-20 (×2): 7 [IU] via SUBCUTANEOUS
  Administered 2019-02-20: 3 [IU] via SUBCUTANEOUS
  Administered 2019-02-20: 7 [IU] via SUBCUTANEOUS
  Administered 2019-02-21 (×2): 3 [IU] via SUBCUTANEOUS

## 2019-02-15 MED ORDER — ALBUTEROL SULFATE HFA 108 (90 BASE) MCG/ACT IN AERS: 1.0000 | INHALATION_SPRAY | Freq: Four times a day (QID) | RESPIRATORY_TRACT | Status: DC

## 2019-02-15 MED ORDER — PANTOPRAZOLE SODIUM 40 MG PO TBEC
40.0000 mg | DELAYED_RELEASE_TABLET | Freq: Every day | ORAL | Status: DC
  Administered 2019-02-15 – 2019-02-21 (×7): 40 mg via ORAL
  Filled 2019-02-15 (×8): qty 1

## 2019-02-15 MED ORDER — ENOXAPARIN SODIUM 80 MG/0.8ML ~~LOC~~ SOLN
0.5000 mg/kg | SUBCUTANEOUS | Status: DC
  Administered 2019-02-15 – 2019-02-20 (×6): 75 mg via SUBCUTANEOUS
  Filled 2019-02-15 (×6): qty 0.8

## 2019-02-15 MED ORDER — INSULIN ASPART 100 UNIT/ML ~~LOC~~ SOLN
0.0000 [IU] | Freq: Every day | SUBCUTANEOUS | Status: DC
  Administered 2019-02-16: 3 [IU] via SUBCUTANEOUS
  Administered 2019-02-17: 2 [IU] via SUBCUTANEOUS
  Administered 2019-02-18: 4 [IU] via SUBCUTANEOUS
  Administered 2019-02-19: 3 [IU] via SUBCUTANEOUS

## 2019-02-15 NOTE — Progress Notes (Signed)
[place] TEAM 1 - Stepdown/ICU TEAM  Marcus Fritz  ZOX:[id number] DOB: 1961-07-22 DOA: 02/14/2019 PCP: System, Provider Not In    Brief Narrative:  57yo w/ a hx of DM, HTN, and Obesity who [facility] w/ c/o 2-3 days of generalized fatigue, subjective fevers, diarrhea, and myalgia.   In the ED he was found to be hypotensive into the 60s systolic, and suffering w/ acute renal failure w/ a crt of 2.49 (baseline 1.0 earlier this month). His BP improved w/ volume, but he remained very weak. He was also incidentally noted to be SARS-CoV-2+, though he denies SOB, cough, abdom pain, or diarrhea.    Significant Events: 6/22 [facility]  COVID-19 specific Treatment: None thus far   Subjective: Patient states he is feeling better today.  He still feels quite tired.  He reports no further diarrhea.  He reports a poor appetite but feels that is slightly improved.  He denies chest pain or significant shortness of breath at this time.  His chest x-ray today did raise concern for a possible new right base infiltrate.  Assessment & Plan:  COVID Gastroenteritis  Slow hydration given possible developing pulmonary infiltrate - imodium - encourage diet   ?New R base infiltrate Noted on follow-up chest x-ray this morning post rehydration -follow clinically as this could represent COVID pneumonia just now appearing due to volume resuscitation -presently stable on room air with no dyspnea  Hypotension - Severe Dehydration  Due to fever and GI losses -essentially resolved with volume resuscitation  Acute renal failure  Likely simple pre-renal azotemia worsened by ongoing use of ARB - stopped ARB - much improved with volume expansion  Hyponatremia  Sodium now normal with volume expansion  HTN Blood pressure vacillating -follow without change for now  HLD Hold Lipitor until intake improved   DM2 A1c 7.8 11/08/18 -CBG not at goal -adjust treatment and follow   Chronic Intermittent Idiopathic Urticaria Followed in outpt setting - has seen a Dermatologist w/ no etiology found   Morbid Obesity - Body mass index is 44.86 kg/m.   DVT prophylaxis: SQ heparin  Code Status: FULL CODE Family Communication:  Disposition Plan:   Consultants:  none  Antimicrobials:  None   Objective: Blood pressure 113/67, pulse (!) 107, temperature 99.4 F (37.4 C), temperature source Oral, resp. rate 17, height 6\' 1"  (1.854 m), weight (!) 154 kg, SpO2 94 %.  Intake/Output Summary (Last 24 hours) at 02/15/2019 1613 Last data filed at 02/15/2019 0830 Gross per 24 hour  Intake 5492.75 ml  Output 1225 ml  Net 4267.75 ml   Filed Weights   02/14/19 1008 02/14/19 1801  Weight: (!) 154.2 kg (!) 154 kg    Examination: General: No acute respiratory distress at rest and chair Lungs: New bibasilar crackles appreciable on exam with no wheezing Cardiovascular: RRR without murmur or rub Abdomen: Nontender, obese, soft, bowel sounds positive, no rebound, no ascites, no appreciable mass Extremities: No significant cyanosis, clubbing, or edema bilateral lower extremities  CBC: Recent Labs  Lab 02/14/19 1323 02/15/19 0405  WBC 14.2* 8.8  NEUTROABS 12.4* 7.6  HGB 13.9 13.5  HCT 45.0 44.6  MCV 85.1 86.1  PLT 205 210   Basic Metabolic Panel: Recent Labs  Lab 02/14/19 1323 02/15/19 0405  NA 133* 138  K 4.2 3.5  CL 101 101  CO2 20* 24  GLUCOSE 237* 164*  BUN 27* 18  CREATININE 2.49* 1.10  CALCIUM 9.0 9.1  GFR: Estimated Creatinine Clearance: 113.4 mL/min (by C-G formula based on SCr of 1.1 mg/dL).  Liver Function Tests: Recent Labs  Lab 02/15/19 0405  AST 21  ALT 30  ALKPHOS 34*  BILITOT 0.5  PROT 7.0  ALBUMIN 3.7    CBG: Recent Labs  Lab 02/14/19 1732 02/14/19 2119 02/15/19 0759 02/15/19 1200  GLUCAP 158* 164* 165* 209*    Recent Results (from the past 240 hour(s))  Novel Coronavirus,NAA,(SEND-OUT TO REF LAB - TAT 24-48 hrs);  Hosp Order     Status: Abnormal   Collection Time: 02/14/19 10:37 AM   Specimen: Nasopharyngeal Swab; Respiratory  Result Value Ref Range Status   SARS-CoV-2, NAA DETECTED (A) NOT DETECTED Final    Comment: (NOTE) Testing was performed using the cobas(R) SARS-CoV-2 test. This test was developed and its performance characteristics determined by [employer]. This test has not been FDA cleared or approved. This test has been authorized by FDA under an Emergency Use Authorization (EUA). This test is only authorized for the duration of time the declaration that circumstances exist justifying the authorization of the emergency use of in vitro diagnostic tests for detection of SARS-CoV-2 virus and/or diagnosis of COVID-19 infection under section 564(b)(1) of the Act, 21 U.S.C. 030SPQ-3(R)(0), unless the authorization is terminated or revoked sooner. When diagnostic testing is negative, the possibility of a false negative result should be considered in the context of a patient's recent exposures and the presence of clinical signs and symptoms consistent with COVID-19. An individual without symptoms of COVID-19 and who is not shedding SARS-CoV-2 virus would expect to have  a negative (not detected) result in this assay. Performed At: [facility] [address] [place], [place] [id number] Rush Farmer MD LK:[id number]    [place]  Final    Comment: Performed at [facility], [address] [place], [place] [address]  Blood culture (routine x 2)     Status: None (Preliminary result)   Collection Time: 02/14/19  1:23 PM   Specimen: BLOOD  Result Value Ref Range Status   Specimen Description   Final    BLOOD RIGHT ANTECUBITAL Performed at [facility], [address] [place], [place] [address]    Special Requests   Final    BOTTLES DRAWN AEROBIC AND ANAEROBIC Blood Culture adequate volume Performed at [facility], [address] [place], [place] [address]    Culture   Final    NO GROWTH < 24 HOURS Performed at [facility], [place] [address] [place], [place] [address]    Report Status PENDING  Incomplete  SARS Coronavirus 2 (Hosp order,Performed in [facility] lab via [employer] ID)     Status: Abnormal   Collection Time: 02/14/19  1:53 PM   Specimen: Dry Nasal Swab ([employer] ID Now)  Result Value Ref Range Status   SARS Coronavirus 2 ([employer] ID Now) POSITIVE (A) NEGATIVE Final    Comment: RESULT CALLED TO, READ BACK BY AND VERIFIED WITH: AMY HARTLEY RN @1416  02/14/2019 OLSONM (NOTE) Interpretive Result Comment(s): COVID 19 Positive SARS CoV 2 target nucleic acids are DETECTED. The SARS CoV 2 RNA is generally detectable in upper and lower respiratory specimens during the acute phase of infection.  Positive results are indicative of active infection with SARS CoV 2.  Clinical correlation with patient history and other diagnostic information is necessary to determine patient infection status.  Positive results do not rule out bacterial infection  or coinfection with other viruses. The expected result is Negative. COVID 19 Negative SARS CoV 2 target nucleic acids are NOT DETECTED. The SARS CoV 2 RNA is generally detectable in upper and lower respiratory specimens during the acute phase of infection.  Negative results do not preclude SARS CoV 2 infection, do not rule out coinfections with other pathogens, and should not be used as the sole basis for treatme nt or other patient management decisions.  Negative results must be combined with clinical observations, patient history, and epidemiological information. The expected result is Negative. Invalid Presence or absence of SARS CoV 2 nucleic acids cannot be determined. Repeat testing was performed on the submitted specimen and repeated Invalid results were obtained.  If clinically indicated, additional testing on a new specimen with  an alternate test methodology [id number]) is advised.  The SARS CoV 2 RNA is generally detectable in upper and lower respiratory specimens during the acute phase of infection. The expected result is Negative. Fact Sheet for Patients:  http://www.graves-ford.org/https://www.fda.gov/media/136524/download Fact Sheet for Healthcare Providers: En[employer]Concern.sihttps://www.fda.gov/media/136523/download This test is not yet approved or cleared by the [place] FDA and has been authorized for detection and/or diagnosis of SARS CoV 2 by FDA under an Emergency Use Authorization (EUA).  This EU A will remain in effect (meaning this test can be used) for the duration of the COVID19 declaration under Section 564(b)(1) of the Act, 21 U.S.C. section 917 456 [id number] 3(b)(1), unless the authorization is terminated or revoked sooner. Performed at [facility] [place] [facility], [address] [place], [place] [address]   Blood culture (routine x 2)     Status: None (Preliminary result)   Collection Time: 02/14/19  2:00 PM   Specimen: BLOOD  Result Value Ref Range Status   Specimen Description   Final    BLOOD BLOOD RIGHT FOREARM Performed at [facility] [place], [address] [place], [place] [address]    Special Requests   Final    BOTTLES DRAWN AEROBIC AND ANAEROBIC Blood Culture adequate volume Performed at [facility] [place], [address] [place], [place] [address]    Culture   Final    NO GROWTH < 24 HOURS Performed at [facility], [address] [place], [place] [address]    Report Status PENDING  Incomplete     Scheduled Meds: . famotidine  20 mg Oral BID  . heparin  5,000 Units Subcutaneous Q8H  . insulin aspart  0-15 Units Subcutaneous TID WC  . insulin aspart  0-5 Units Subcutaneous QHS  . pantoprazole  40 mg Oral Daily     LOS: 1 day   Lonia BloodJeffrey T. Jamina Macbeth, MD [facility] Office  [phone] Pager - Text Page per Amion  If 7PM-7AM, please contact night-coverage per Amion 02/15/2019, 4:13 PM

## 2019-02-15 NOTE — Progress Notes (Signed)
Update given to sister Son Barkan at [phone].

## 2019-02-15 NOTE — Progress Notes (Signed)
[place] for Lovenox  Indication: VTE prophylaxis  Allergies  Allergen Reactions  . Peanuts [Peanut Oil] Rash    Pt states this is an incorrect allergy entry in his chart. 02-14-19    Patient Measurements: Height: 6\' 1"  (185.4 cm) Weight: (!) 339 lb 8.1 oz (154 kg) IBW/kg (Calculated) : 79.9  Vital Signs: Temp: 99.4 F (37.4 C) (06/23 0801) Temp Source: Oral (06/23 0801) BP: 113/67 (06/23 0800) Pulse Rate: 107 (06/23 0800)  Labs: Recent Labs    02/14/19 1323 02/15/19 0405  HGB 13.9 13.5  HCT 45.0 44.6  PLT 205 210  CREATININE 2.49* 1.10    Estimated Creatinine Clearance: 113.4 mL/min (by C-G formula based on SCr of 1.1 mg/dL).   Medical History: Past Medical History:  Diagnosis Date  . Diabetes mellitus without complication ([place])   . Hyperlipidemia   . Hypertension    Assessment: 58 y/o M w/ COVID-19 gastroenteritis and possible developing pulmonary infiltrate. Pharmacy consulted to dose Lovenox for VTE prophylaxis. Patient was previously on [facility] w/ last dose earlier today.   BMI= 45  Goal of Therapy:  Monitor platelets by anticoagulation protocol: Yes   Plan:  - Will order Lovenox 75 mg daily (0.5 mg/kg/day). - Will follow renal function and CBC - Monitor for bleeding   Ulice Dash D 02/15/2019,4:37 PM

## 2019-02-16 ENCOUNTER — Inpatient Hospital Stay (HOSPITAL_COMMUNITY): Payer: Commercial Managed Care - PPO

## 2019-02-16 DIAGNOSIS — A0839 Other viral enteritis: Secondary | ICD-10-CM

## 2019-02-16 DIAGNOSIS — I10 Essential (primary) hypertension: Secondary | ICD-10-CM

## 2019-02-16 LAB — COMPREHENSIVE METABOLIC PANEL
ALT: 35 U/L (ref 0–44)
AST: 25 U/L (ref 15–41)
Albumin: 3.4 g/dL — ABNORMAL LOW (ref 3.5–5.0)
Alkaline Phosphatase: 31 U/L — ABNORMAL LOW (ref 38–126)
Anion gap: 11 (ref 5–15)
BUN: 15 mg/dL (ref 6–20)
CO2: 25 mmol/L (ref 22–32)
Calcium: 8.8 mg/dL — ABNORMAL LOW (ref 8.9–10.3)
Chloride: 102 mmol/L (ref 98–111)
Creatinine, Ser: 1.1 mg/dL (ref 0.61–1.24)
GFR calc Af Amer: >60 mL/min (ref >60)
GFR calc non Af Amer: >60 mL/min (ref >60)
Glucose, Bld: 149 mg/dL — ABNORMAL HIGH (ref 70–99)
Potassium: 3.9 mmol/L (ref 3.5–5.1)
Sodium: 138 mmol/L (ref 135–145)
Total Bilirubin: 0.6 mg/dL (ref 0.3–1.2)
Total Protein: 6.9 g/dL (ref 6.5–8.1)

## 2019-02-16 LAB — GLUCOSE, CAPILLARY
Glucose-Capillary: 163 mg/dL — ABNORMAL HIGH (ref 70–99)
Glucose-Capillary: 159 mg/dL — ABNORMAL HIGH (ref 70–99)
Glucose-Capillary: 203 mg/dL — ABNORMAL HIGH (ref 70–99)
Glucose-Capillary: 267 mg/dL — ABNORMAL HIGH (ref 70–99)

## 2019-02-16 LAB — CBC WITH DIFFERENTIAL/PLATELET
Abs Immature Granulocytes: 0.03 10*3/uL (ref 0.00–0.07)
Basophils Absolute: 0 10*3/uL (ref 0.0–0.1)
Basophils Relative: 0 %
Eosinophils Absolute: 0 10*3/uL (ref 0.0–0.5)
Eosinophils Relative: 0 %
HCT: 43.2 % (ref 39.0–52.0)
Hemoglobin: 13.2 g/dL (ref 13.0–17.0)
Immature Granulocytes: 1 %
Lymphocytes Relative: 14 %
Lymphs Abs: 0.9 10*3/uL (ref 0.7–4.0)
MCH: 26.1 pg (ref 26.0–34.0)
MCHC: 30.6 g/dL (ref 30.0–36.0)
MCV: 85.5 fL (ref 80.0–100.0)
Monocytes Absolute: 0.3 10*3/uL (ref 0.1–1.0)
Monocytes Relative: 5 %
Neutro Abs: 5.1 10*3/uL (ref 1.7–7.7)
Neutrophils Relative %: 80 %
Platelets: 174 10*3/uL (ref 150–400)
RBC: 5.05 MIL/uL (ref 4.22–5.81)
RDW: 15.9 % — ABNORMAL HIGH (ref 11.5–15.5)
WBC: 6.3 10*3/uL (ref 4.0–10.5)
nRBC: 0 % (ref 0.0–0.2)

## 2019-02-16 LAB — NOVEL CORONAVIRUS, NAA (HOSP ORDER, SEND-OUT TO REF LAB; TAT 18-24 HRS): SARS-CoV-2, NAA: DETECTED — AB

## 2019-02-16 LAB — D-DIMER, QUANTITATIVE: D-Dimer, Quant: 0.85 ug/mL-FEU — ABNORMAL HIGH (ref 0.00–0.50)

## 2019-02-16 LAB — FERRITIN: Ferritin: 424 ng/mL — ABNORMAL HIGH (ref 24–336)

## 2019-02-16 LAB — C-REACTIVE PROTEIN: CRP: 22.8 mg/dL — ABNORMAL HIGH (ref <1.0)

## 2019-02-16 LAB — HIV ANTIBODY (ROUTINE TESTING W REFLEX): HIV Screen 4th Generation wRfx: NONREACTIVE

## 2019-02-16 MED ORDER — INSULIN GLARGINE 100 UNIT/ML ~~LOC~~ SOLN
20.0000 [IU] | Freq: Every day | SUBCUTANEOUS | Status: DC
  Administered 2019-02-16 – 2019-02-17 (×2): 20 [IU] via SUBCUTANEOUS
  Filled 2019-02-16 (×2): qty 0.2

## 2019-02-16 MED ORDER — METHYLPREDNISOLONE SODIUM SUCC 125 MG IJ SOLR
60.0000 mg | Freq: Three times a day (TID) | INTRAMUSCULAR | Status: AC
  Administered 2019-02-16 – 2019-02-19 (×9): 60 mg via INTRAVENOUS
  Filled 2019-02-16 (×10): qty 2

## 2019-02-16 NOTE — Progress Notes (Signed)
Update given to [facility] via phone number [phone].

## 2019-02-16 NOTE — Progress Notes (Signed)
PROGRESS NOTE                                                                                                                                                                                                             Patient Demographics:    Marcus Fritz, is a 58 y.o. male, DOB - 1961/01/27, YNW:[id number]  Outpatient Primary MD for the patient is System, Provider Not In    LOS - 2  Chief Complaint  Patient presents with  . Fever       Brief Narrative: Patient is a 58 y.o. male with PMHx of DM, HTN, obesity who presented to [facility] with a few days history of fever, fatigue, diarrhea, in the emergency room he was found to have hypotension and AKI.  Patient was resuscitated with IV fluids-he was found to be COVID-19 positive-and admitted to the hospitalist service.  See below for further details   Subjective:    Trapper Jaime today feels better-diarrhea has resolved-tolerating diet.  Low-grade fever this morning   Assessment  & Plan :   Gastroenteritis: Secondary to COVID 19-GI symptoms have markedly improved-no diarrhea-tolerating diet.  Continue supportive care.  COVID-19 pneumonia: Not hypoxic-O2 saturation in the mid 90s on room air.  Remains with low-grade fever this morning.  Inflammatory markers including CRP and ferritin are elevated-start IV Solu-Medrol-if hypoxia develops-we will need Remdesivir.  COVID-19 Labs:  Recent Labs    02/15/19 0405 02/16/19 0440  DDIMER 0.64* 0.85*  FERRITIN 263 424*  CRP 16.6* 22.8*    Lab Results  Component Value Date   SARSCOV2NAA POSITIVE (A) 02/14/2019   SARSCOV2NAA DETECTED (A) 02/14/2019     COVID-19 Medications: 6/24>> IV Solu-Medrol  Acute kidney injury: Likely hemodynamically mediated-in the setting of diarrhea-resolved.  Follow electrolytes periodically  DM-2: CBGs stable-since patient will be started on steroids-we will start Lantus 20 units nightly and  monitor closely.  HTN: Blood pressure controlled without the use of any antihypertensives-oral antihypertensives remain on hold.  GERD: Continue PPI  Condition - Stable  Family Communication  :  Sister updated over the phone  Code Status :  Full Code  Diet :  Diet Order            Diet Carb Modified Fluid consistency: Thin; Room service appropriate? Yes  Diet effective now  Disposition Plan  :  Remain inpatient  DVT Prophylaxis  :  Lovenox   Lab Results  Component Value Date   PLT 174 02/16/2019    Inpatient Medications  Scheduled Meds: . enoxaparin (LOVENOX) injection  0.5 mg/kg Subcutaneous Q24H  . famotidine  20 mg Oral BID  . insulin aspart  0-20 Units Subcutaneous TID WC  . insulin aspart  0-5 Units Subcutaneous QHS  . pantoprazole  40 mg Oral Daily   Continuous Infusions: . sodium chloride 50 mL/hr at 02/16/19 0800   PRN Meds:.acetaminophen, albuterol, bisacodyl, hydrOXYzine, loperamide, ondansetron **OR** ondansetron (ZOFRAN) IV, senna-docusate, traMADol  Antibiotics  :    Anti-infectives (From admission, onward)   Start     Dose/Rate Route Frequency Ordered Stop   02/14/19 1430  vancomycin (VANCOCIN) IVPB 1000 mg/200 mL premix     1,000 mg 200 mL/hr over 60 Minutes Intravenous Every 1 hr x 2 02/14/19 1332 02/14/19 1659   02/14/19 1330  ceFEPIme (MAXIPIME) 2 g in sodium chloride 0.9 % 100 mL IVPB     2 g 200 mL/hr over 30 Minutes Intravenous  Once 02/14/19 1329 02/14/19 1447   02/14/19 1330  vancomycin (VANCOCIN) 2,000 mg in sodium chloride 0.9 % 500 mL IVPB  Status:  Discontinued     2,000 mg 250 mL/hr over 120 Minutes Intravenous  Once 02/14/19 1329 02/14/19 1537   02/14/19 1330  ceFEPIme (MAXIPIME) 2 g injection    Note to Pharmacy: Lucendia Herrlich   : cabinet override      02/14/19 1330 02/14/19 1348       Time Spent in minutes  25   Jeoffrey Massed M.D on 02/16/2019 at 11:47 AM  To page go to [email or web address] - use universal  password  [facility] -  Office  [phone]   Admit date - 02/14/2019    2    Objective:   Vitals:   02/16/19 0027 02/16/19 0527 02/16/19 0835 02/16/19 1143  BP:  124/71 117/69 120/69  Pulse:  91  (!) 101  Resp:  (!) 27 (!) 26 (!) 27  Temp: 99 F (37.2 C) (!) 100.8 F (38.2 C) (!) 100.5 F (38.1 C) 99.4 F (37.4 C)  TempSrc:  Oral Oral Oral  SpO2:  96% 95% 94%  Weight:      Height:        Wt Readings from Last 3 Encounters:  02/14/19 (!) 154 kg  09/30/12 (!) 147.4 kg     Intake/Output Summary (Last 24 hours) at 02/16/2019 1147 Last data filed at 02/16/2019 0800 Gross per 24 hour  Intake 3083.96 ml  Output 2650 ml  Net 433.96 ml     Physical Exam Gen Exam:Alert awake-not in any distress HEENT:atraumatic, normocephalic Chest: B/L clear to auscultation anteriorly CVS:S1S2 regular Abdomen:soft non tender, non distended Extremities:no edema Neurology: Non focal Skin: no rash   Data Review:    CBC Recent Labs  Lab 02/14/19 1323 02/15/19 0405 02/16/19 0440  WBC 14.2* 8.8 6.3  HGB 13.9 13.5 13.2  HCT 45.0 44.6 43.2  PLT 205 210 174  MCV 85.1 86.1 85.5  MCH 26.3 26.1 26.1  MCHC 30.9 30.3 30.6  RDW 16.1* 16.2* 15.9*  LYMPHSABS 0.5* 0.7 0.9  MONOABS 1.1* 0.4 0.3  EOSABS 0.0 0.0 0.0  BASOSABS 0.0 0.0 0.0    Chemistries  Recent Labs  Lab 02/14/19 1323 02/15/19 0405 02/16/19 0440  NA 133* 138 138  K 4.2 3.5 3.9  CL 101 101 102  CO2  20* 24 25  GLUCOSE 237* 164* 149*  BUN 27* 18 15  CREATININE 2.49* 1.10 1.10  CALCIUM 9.0 9.1 8.8*  AST  --  21 25  ALT  --  30 35  ALKPHOS  --  34* 31*  BILITOT  --  0.5 0.6   ------------------------------------------------------------------------------------------------------------------ No results for input(s): CHOL, HDL, LDLCALC, TRIG, CHOLHDL, LDLDIRECT in the last 72 hours.  No results found for: HGBA1C  ------------------------------------------------------------------------------------------------------------------ No results for input(s): TSH, T4TOTAL, T3FREE, THYROIDAB in the last 72 hours.  Invalid input(s): FREET3 ------------------------------------------------------------------------------------------------------------------ Recent Labs    02/15/19 0405 02/16/19 0440  FERRITIN 263 424*    Coagulation profile No results for input(s): INR, PROTIME in the last 168 hours.  Recent Labs    02/15/19 0405 02/16/19 0440  DDIMER 0.64* 0.85*    Cardiac Enzymes No results for input(s): CKMB, TROPONINI, MYOGLOBIN in the last 168 hours.  Invalid input(s): CK ------------------------------------------------------------------------------------------------------------------ No results found for: BNP  Micro Results Recent Results (from the past 240 hour(s))  Novel Coronavirus,NAA,(SEND-OUT TO REF LAB - TAT 24-48 hrs); Hosp Order     Status: Abnormal   Collection Time: 02/14/19 10:37 AM   Specimen: Nasopharyngeal Swab; Respiratory  Result Value Ref Range Status   SARS-CoV-2, NAA DETECTED (A) NOT DETECTED Final    Comment: (NOTE) Testing was performed using the cobas(R) SARS-CoV-2 test. This test was developed and its performance characteristics determined by [employer]. This test has not been FDA cleared or approved. This test has been authorized by FDA under an Emergency Use Authorization (EUA). This test is only authorized for the duration of time the declaration that circumstances exist justifying the authorization of the emergency use of in vitro diagnostic tests for detection of SARS-CoV-2 virus and/or diagnosis of COVID-19 infection under section 564(b)(1) of the Act, 21 U.S.C. 094BSJ-6(G)(8), unless the authorization is terminated or revoked sooner. When diagnostic testing is negative, the possibility of a false negative result should be considered in the  context of a patient's recent exposures and the presence of clinical signs and symptoms consistent with COVID-19. An individual without symptoms of COVID-19 and who is not shedding SARS-CoV-2 virus would expect to have  a negative (not detected) result in this assay. Performed At: [facility] [address] [place], [place] [id number] Rush Farmer MD YY:[id number]    [place]  Final    Comment: Performed at [facility], [address] [place], [place] [address]  Blood culture (routine x 2)     Status: None (Preliminary result)   Collection Time: 02/14/19  1:23 PM   Specimen: BLOOD  Result Value Ref Range Status   Specimen Description   Final    BLOOD RIGHT ANTECUBITAL Performed at [facility], [address] [place], [place] [address]    Special Requests   Final    BOTTLES DRAWN AEROBIC AND ANAEROBIC Blood Culture adequate volume Performed at [facility], [address] [place], [place] [address]    Culture   Final    NO GROWTH < 24 HOURS Performed at [facility], [place] [address] [place], [place] [address]    Report Status PENDING  Incomplete  SARS Coronavirus 2 (Hosp order,Performed in [facility] lab via [employer] ID)     Status: Abnormal   Collection Time: 02/14/19  1:53 PM   Specimen: Dry Nasal Swab ([employer] ID Now)  Result Value Ref Range Status   SARS Coronavirus 2 (  [employer] ID Now) POSITIVE (A) NEGATIVE Final    Comment: RESULT CALLED TO, READ BACK BY AND VERIFIED WITH: AMY HARTLEY RN @1416  02/14/2019 OLSONM (NOTE) Interpretive Result Comment(s): COVID 19 Positive SARS CoV 2 target nucleic acids are DETECTED. The SARS CoV 2 RNA is generally detectable in upper and lower respiratory specimens during the acute phase of infection.  Positive results are indicative of active infection with SARS CoV 2.  Clinical correlation with patient history and other diagnostic information is necessary to  determine patient infection status.  Positive results do not rule out bacterial infection or coinfection with other viruses. The expected result is Negative. COVID 19 Negative SARS CoV 2 target nucleic acids are NOT DETECTED. The SARS CoV 2 RNA is generally detectable in upper and lower respiratory specimens during the acute phase of infection.  Negative results do not preclude SARS CoV 2 infection, do not rule out coinfections with other pathogens, and should not be used as the sole basis for treatme nt or other patient management decisions.  Negative results must be combined with clinical observations, patient history, and epidemiological information. The expected result is Negative. Invalid Presence or absence of SARS CoV 2 nucleic acids cannot be determined. Repeat testing was performed on the submitted specimen and repeated Invalid results were obtained.  If clinically indicated, additional testing on a new specimen with an alternate test methodology [phone]([id number]) is advised.  The SARS CoV 2 RNA is generally detectable in upper and lower respiratory specimens during the acute phase of infection. The expected result is Negative. Fact Sheet for Patients:  http://www.graves-ford.org/https://www.fda.gov/media/136524/download Fact Sheet for Healthcare Providers: En[employer]Concern.sihttps://www.fda.gov/media/136523/download This test is not yet approved or cleared by the [place] FDA and has been authorized for detection and/or diagnosis of SARS CoV 2 by FDA under an Emergency Use Authorization (EUA).  This EU A will remain in effect (meaning this test can be used) for the duration of the COVID19 declaration under Section 564(b)(1) of the Act, 21 U.S.C. section [phone] 3(b)(1), unless the authorization is terminated or revoked sooner. Performed at [facility] [place], [address] [place], [place] [address]   Blood culture (routine x 2)     Status: None (Preliminary result)   Collection Time: 02/14/19  2:00 PM    Specimen: BLOOD  Result Value Ref Range Status   Specimen Description   Final    BLOOD BLOOD RIGHT FOREARM Performed at [facility] [place], [address] [place], [place] [address]    Special Requests   Final    BOTTLES DRAWN AEROBIC AND ANAEROBIC Blood Culture adequate volume Performed at [facility] [place], [address] [place], [place] [address]    Culture   Final    NO GROWTH < 24 HOURS Performed at [facility], [address] [place], [place] [address]    Report Status PENDING  Incomplete    Radiology Reports Dg Chest Port 1 View  Result Date: 02/16/2019 CLINICAL DATA:  COVID-19 EXAM: PORTABLE CHEST 1 VIEW COMPARISON:  Yesterday FINDINGS: Subtle bilateral infiltrate. Normal heart size. Prominent upper mediastinal with likely from portable technique. The hila are negative. IMPRESSION: Subtle bilateral infiltrate.  No significant change from prior. Electronically Signed   By: Marnee SpringJonathon  Watts M.D.   On: 02/16/2019 08:37   Dg Chest Port 1 View  Result Date: 02/15/2019 CLINICAL DATA:  COVID-19 infection. EXAM: PORTABLE CHEST 1 VIEW COMPARISON:  Chest x-ray from yesterday. FINDINGS: The heart size and mediastinal  contours are within normal limits. Normal pulmonary vascularity. Minimal increased density at the right lung base. No focal consolidation, pleural effusion, or pneumothorax. No acute osseous abnormality. IMPRESSION: 1. Minimal increased density at the right lung base may reflect atelectasis, although early COVID-19 pneumonia could have a similar appearance. Electronically Signed   By: Obie DredgeWilliam T Derry M.D.   On: 02/15/2019 08:29   Dg Chest Portable 1 View  Result Date: 02/14/2019 CLINICAL DATA:  Cough, weakness for 2 days EXAM: PORTABLE CHEST 1 VIEW COMPARISON:  None. FINDINGS: The heart size and mediastinal contours are within normal limits. Both lungs are clear. The visualized skeletal structures are unremarkable. IMPRESSION: No active disease.  Electronically Signed   By: Elige KoHetal  Patel   On: 02/14/2019 10:50

## 2019-02-16 NOTE — Progress Notes (Signed)
Received call from CCMD that new increase in Lead II of QT, notified MD. Ordered EKG, completed .  MD advised it was OK.

## 2019-02-16 NOTE — Plan of Care (Signed)

## 2019-02-16 NOTE — Progress Notes (Signed)
Pt c/o rash like on chest, back, arms and abdomen. MD notified and steroids started

## 2019-02-17 LAB — CBC WITH DIFFERENTIAL/PLATELET
Abs Immature Granulocytes: 0.02 10*3/uL (ref 0.00–0.07)
Basophils Absolute: 0 10*3/uL (ref 0.0–0.1)
Basophils Relative: 0 %
Eosinophils Absolute: 0 10*3/uL (ref 0.0–0.5)
Eosinophils Relative: 0 %
HCT: 41.4 % (ref 39.0–52.0)
Hemoglobin: 12.7 g/dL — ABNORMAL LOW (ref 13.0–17.0)
Immature Granulocytes: 1 %
Lymphocytes Relative: 9 %
Lymphs Abs: 0.4 10*3/uL — ABNORMAL LOW (ref 0.7–4.0)
MCH: 26.1 pg (ref 26.0–34.0)
MCHC: 30.7 g/dL (ref 30.0–36.0)
MCV: 85.2 fL (ref 80.0–100.0)
Monocytes Absolute: 0.2 10*3/uL (ref 0.1–1.0)
Monocytes Relative: 4 %
Neutro Abs: 3.2 10*3/uL (ref 1.7–7.7)
Neutrophils Relative %: 86 %
Platelets: 183 10*3/uL (ref 150–400)
RBC: 4.86 MIL/uL (ref 4.22–5.81)
RDW: 15.4 % (ref 11.5–15.5)
WBC: 3.7 10*3/uL — ABNORMAL LOW (ref 4.0–10.5)
nRBC: 0 % (ref 0.0–0.2)

## 2019-02-17 LAB — GLUCOSE, CAPILLARY
Glucose-Capillary: 203 mg/dL — ABNORMAL HIGH (ref 70–99)
Glucose-Capillary: 287 mg/dL — ABNORMAL HIGH (ref 70–99)
Glucose-Capillary: 274 mg/dL — ABNORMAL HIGH (ref 70–99)
Glucose-Capillary: 257 mg/dL — ABNORMAL HIGH (ref 70–99)

## 2019-02-17 LAB — COMPREHENSIVE METABOLIC PANEL
ALT: 37 U/L (ref 0–44)
AST: 26 U/L (ref 15–41)
Albumin: 3.2 g/dL — ABNORMAL LOW (ref 3.5–5.0)
Alkaline Phosphatase: 30 U/L — ABNORMAL LOW (ref 38–126)
Anion gap: 9 (ref 5–15)
BUN: 14 mg/dL (ref 6–20)
CO2: 24 mmol/L (ref 22–32)
Calcium: 8.8 mg/dL — ABNORMAL LOW (ref 8.9–10.3)
Chloride: 103 mmol/L (ref 98–111)
Creatinine, Ser: 0.89 mg/dL (ref 0.61–1.24)
GFR calc Af Amer: >60 mL/min (ref >60)
GFR calc non Af Amer: >60 mL/min (ref >60)
Glucose, Bld: 268 mg/dL — ABNORMAL HIGH (ref 70–99)
Potassium: 4.5 mmol/L (ref 3.5–5.1)
Sodium: 136 mmol/L (ref 135–145)
Total Bilirubin: 0.3 mg/dL (ref 0.3–1.2)
Total Protein: 6.9 g/dL (ref 6.5–8.1)

## 2019-02-17 LAB — C-REACTIVE PROTEIN: CRP: 19.2 mg/dL — ABNORMAL HIGH (ref <1.0)

## 2019-02-17 LAB — FERRITIN: Ferritin: 517 ng/mL — ABNORMAL HIGH (ref 24–336)

## 2019-02-17 LAB — D-DIMER, QUANTITATIVE: D-Dimer, Quant: 0.81 ug/mL-FEU — ABNORMAL HIGH (ref 0.00–0.50)

## 2019-02-17 MED ORDER — SODIUM CHLORIDE 0.9 % IV SOLN
200.0000 mg | Freq: Once | INTRAVENOUS | Status: AC
  Administered 2019-02-17: 200 mg via INTRAVENOUS
  Filled 2019-02-17: qty 40

## 2019-02-17 MED ORDER — SODIUM CHLORIDE 0.9 % IV SOLN
100.0000 mg | INTRAVENOUS | Status: AC
  Administered 2019-02-18 – 2019-02-21 (×4): 100 mg via INTRAVENOUS
  Filled 2019-02-17 (×4): qty 20

## 2019-02-17 NOTE — Progress Notes (Signed)
PROGRESS NOTE                                                                                                                                                                                                             Patient Demographics:    Marcus Fritz, is a 58 y.o. male, DOB - [birth year], ZOX:[id number]  Outpatient Primary MD for the patient is System, Provider Not In    LOS - 3  Chief Complaint  Patient presents with  . Fever       Brief Narrative: Patient is a 58 y.o. male with PMHx of DM, HTN, obesity who presented to [facility] with a few days history of fever, fatigue, diarrhea, in the emergency room he was found to have hypotension and AKI.  Patient was resuscitated with IV fluids-he was found to be COVID-19 positive-and admitted to the hospitalist service.  See below for further details   Subjective:    Marcus Fritz does not feel much better compared to yesterday-although no fever-he continues to cough.  O2 saturation room air dipped down to 87-88% while I was in the room.   Assessment  & Plan :   Gastroenteritis: Secondary to COVID 19-GI symptoms have markedly improved-no diarrhea-tolerating diet.  Continue supportive care.  COVID-19 pneumonia: Seems to be developing hypoxia overnight-O2 saturations down to 87-88% while I was in the room.  Inflammatory markers remain elevated.  Continue Solu-Medrol-since hypoxia is developing-we will go and start Remdesivir.   COVID-19 Labs:  Recent Labs    02/15/19 0405 02/16/19 0440 02/17/19 0430  DDIMER 0.64* 0.85* 0.81*  FERRITIN 263 424* 517*  CRP 16.6* 22.8* 19.2*    Lab Results  Component Value Date   SARSCOV2NAA POSITIVE (A) 02/14/2019   SARSCOV2NAA DETECTED (A) 02/14/2019     COVID-19 Medications: 6/24>> IV Solu-Medrol  Acute kidney injury: Likely hemodynamically mediated-in the setting of diarrhea-resolved.  Follow electrolytes periodically  DM-2:  CBGs stable-slightly on the higher side-restarted on Lantus on 6/24-continue Lantus 20 units and SSI-monitor and adjust accordingly.    HTN: Blood pressure controlled without the use of any antihypertensives-oral antihypertensives remain on hold.  GERD: Continue PPI  Condition - Stable  Family Communication  :  Sister updated over the phone on 6/25 (claims that family can arrange for stem cell treatment-have conveyed to her that patient appears to have  mild hypoxia and we are starting Remdesivir-do not think at this point any further escalation in care is required)  Code Status :  Full Code  Diet :  Diet Order            Diet Carb Modified Fluid consistency: Thin; Room service appropriate? Yes  Diet effective now               Disposition Plan  :  Remain inpatient  DVT Prophylaxis  :  Lovenox   Lab Results  Component Value Date   PLT 183 02/17/2019    Inpatient Medications  Scheduled Meds: . enoxaparin (LOVENOX) injection  0.5 mg/kg Subcutaneous Q24H  . famotidine  20 mg Oral BID  . insulin aspart  0-20 Units Subcutaneous TID WC  . insulin aspart  0-5 Units Subcutaneous QHS  . insulin glargine  20 Units Subcutaneous QHS  . methylPREDNISolone (SOLU-MEDROL) injection  60 mg Intravenous Q8H  . pantoprazole  40 mg Oral Daily   Continuous Infusions: . sodium chloride 10 mL/hr at 02/17/19 0612   PRN Meds:.acetaminophen, albuterol, bisacodyl, hydrOXYzine, loperamide, ondansetron **OR** ondansetron (ZOFRAN) IV, senna-docusate, traMADol  Antibiotics  :    Anti-infectives (From admission, onward)   Start     Dose/Rate Route Frequency Ordered Stop   02/14/19 1430  vancomycin (VANCOCIN) IVPB 1000 mg/200 mL premix     1,000 mg 200 mL/hr over 60 Minutes Intravenous Every 1 hr x 2 02/14/19 1332 02/14/19 1659   02/14/19 1330  ceFEPIme (MAXIPIME) 2 g in sodium chloride 0.9 % 100 mL IVPB     2 g 200 mL/hr over 30 Minutes Intravenous  Once 02/14/19 1329 02/14/19 1447   02/14/19  1330  vancomycin (VANCOCIN) 2,000 mg in sodium chloride 0.9 % 500 mL IVPB  Status:  Discontinued     2,000 mg 250 mL/hr over 120 Minutes Intravenous  Once 02/14/19 1329 02/14/19 1537   02/14/19 1330  ceFEPIme (MAXIPIME) 2 g injection    Note to Pharmacy: Lucendia HerrlichAdkins, Andrea   : cabinet override      02/14/19 1330 02/14/19 1348       Time Spent in minutes  25   Jeoffrey MassedShanker Ghimire M.D on 02/17/2019 at 12:55 PM  To page go to [email or web address] - use universal password  [facility] -  Office  [phone]   Admit date - 02/14/2019    3    Objective:   Vitals:   02/16/19 1617 02/16/19 2022 02/17/19 0439 02/17/19 0857  BP: 116/77 (!) 131/42 136/73   Pulse: 88     Resp: (!) 22     Temp: 99.1 F (37.3 C) 99.2 F (37.3 C) 99.1 F (37.3 C) 97.6 F (36.4 C)  TempSrc: Oral Oral Oral   SpO2: 93%     Weight:      Height:        Wt Readings from Last 3 Encounters:  02/14/19 (!) 154 kg  09/30/12 (!) 147.4 kg     Intake/Output Summary (Last 24 hours) at 02/17/2019 1255 Last data filed at 02/17/2019 0859 Gross per 24 hour  Intake 1055.14 ml  Output 3650 ml  Net -2594.86 ml     Physical Exam General appearance:Awake, alert, not in any distress.  Eyes:no scleral icterus. HEENT: Atraumatic and Normocephalic Neck: supple, no JVD. Resp:Good air entry bilaterally,fine bibasilar rales CVS: S1 S2 regular, no murmurs.  GI: Bowel sounds present, Non tender and not distended with no gaurding, rigidity or rebound. Extremities: B/L Lower Ext shows no  edema, both legs are warm to touch Neurology:  Non focal Psychiatric: Normal judgment and insight. Normal mood. Musculoskeletal:No digital cyanosis Skin:No Rash, warm and dry Wounds:N/A   Data Review:    CBC Recent Labs  Lab 02/14/19 1323 02/15/19 0405 02/16/19 0440 02/17/19 0430  WBC 14.2* 8.8 6.3 3.7*  HGB 13.9 13.5 13.2 12.7*  HCT 45.0 44.6 43.2 41.4  PLT 205 210 174 183  MCV 85.1 86.1 85.5 85.2  MCH 26.3 26.1 26.1 26.1   MCHC 30.9 30.3 30.6 30.7  RDW 16.1* 16.2* 15.9* 15.4  LYMPHSABS 0.5* 0.7 0.9 0.4*  MONOABS 1.1* 0.4 0.3 0.2  EOSABS 0.0 0.0 0.0 0.0  BASOSABS 0.0 0.0 0.0 0.0    Chemistries  Recent Labs  Lab 02/14/19 1323 02/15/19 0405 02/16/19 0440 02/17/19 0430  NA 133* 138 138 136  K 4.2 3.5 3.9 4.5  CL 101 101 102 103  CO2 20* 24 25 24   GLUCOSE 237* 164* 149* 268*  BUN 27* 18 15 14   CREATININE 2.49* 1.10 1.10 0.89  CALCIUM 9.0 9.1 8.8* 8.8*  AST  --  21 25 26   ALT  --  30 35 37  ALKPHOS  --  34* 31* 30*  BILITOT  --  0.5 0.6 0.3   ------------------------------------------------------------------------------------------------------------------ No results for input(s): CHOL, HDL, LDLCALC, TRIG, CHOLHDL, LDLDIRECT in the last 72 hours.  No results found for: HGBA1C ------------------------------------------------------------------------------------------------------------------ No results for input(s): TSH, T4TOTAL, T3FREE, THYROIDAB in the last 72 hours.  Invalid input(s): FREET3 ------------------------------------------------------------------------------------------------------------------ Recent Labs    02/16/19 0440 02/17/19 0430  FERRITIN 424* 517*    Coagulation profile No results for input(s): INR, PROTIME in the last 168 hours.  Recent Labs    02/16/19 0440 02/17/19 0430  DDIMER 0.85* 0.81*    Cardiac Enzymes No results for input(s): CKMB, TROPONINI, MYOGLOBIN in the last 168 hours.  Invalid input(s): CK ------------------------------------------------------------------------------------------------------------------ No results found for: BNP  Micro Results Recent Results (from the past 240 hour(s))  Novel Coronavirus,NAA,(SEND-OUT TO REF LAB - TAT 24-48 hrs); Hosp Order     Status: Abnormal   Collection Time: 02/14/19 10:37 AM   Specimen: Nasopharyngeal Swab; Respiratory  Result Value Ref Range Status   SARS-CoV-2, NAA DETECTED (A) NOT DETECTED Final     Comment: (NOTE) Testing was performed using the cobas(R) SARS-CoV-2 test. This test was developed and its performance characteristics determined by [facility]. This test has not been FDA cleared or approved. This test has been authorized by FDA under an Emergency Use Authorization (EUA). This test is only authorized for the duration of time the declaration that circumstances exist justifying the authorization of the emergency use of in vitro diagnostic tests for detection of SARS-CoV-2 virus and/or diagnosis of COVID-19 infection under section 564(b)(1) of the Act, 21 U.S.C. 914NWG-9(F)(6360bbb-3(b)(1), unless the authorization is terminated or revoked sooner. When diagnostic testing is negative, the possibility of a false negative result should be considered in the context of a patient's recent exposures and the presence of clinical signs and symptoms consistent with COVID-19. An individual without symptoms of COVID-19 and who is not shedding SARS-CoV-2 virus would expect to have  a negative (not detected) result in this assay. Performed At: [facility] [address] [place], [place] [id number] Jolene SchimkeNagendra Sanjai MD IO:[phone]    Coronavirus Source NASOPHARYNGEAL  Final    Comment: Performed at [facility] [place], [address] [place], [place] [address]  Blood culture (routine x 2)     Status:  None (Preliminary result)   Collection Time: 02/14/19  1:23 PM   Specimen: BLOOD  Result Value Ref Range Status   Specimen Description   Final    BLOOD RIGHT ANTECUBITAL Performed at [facility], [address] [place], [place] [address]    Special Requests   Final    BOTTLES DRAWN AEROBIC AND ANAEROBIC Blood Culture adequate volume Performed at [facility], [address] [place], [place] [address]    Culture   Final    NO GROWTH 3 DAYS Performed at [facility], [place] [address], [place], [place] [address]    Report Status PENDING   Incomplete  SARS Coronavirus 2 (Hosp order,Performed in [facility] lab via [facility] ID)     Status: Abnormal   Collection Time: 02/14/19  1:53 PM   Specimen: Dry Nasal Swab ([facility] ID Now)  Result Value Ref Range Status   SARS Coronavirus 2 ([facility]ott ID Now) POSITIVE (A) NEGATIVE Final    Comment: RESULT CALLED TO, READ BACK BY AND VERIFIED WITH: AMY HARTLEY RN @1416  02/14/2019 OLSONM (NOTE) Interpretive Result Comment(s): COVID 19 Positive SARS CoV 2 target nucleic acids are DETECTED. The SARS CoV 2 RNA is generally detectable in upper and lower respiratory specimens during the acute phase of infection.  Positive results are indicative of active infection with SARS CoV 2.  Clinical correlation with patient history and other diagnostic information is necessary to determine patient infection status.  Positive results do not rule out bacterial infection or coinfection with other viruses. The expected result is Negative. COVID 19 Negative SARS CoV 2 target nucleic acids are NOT DETECTED. The SARS CoV 2 RNA is generally detectable in upper and lower respiratory specimens during the acute phase of infection.  Negative results do not preclude SARS CoV 2 infection, do not rule out coinfections with other pathogens, and should not be used as the sole basis for treatme nt or other patient management decisions.  Negative results must be combined with clinical observations, patient history, and epidemiological information. The expected result is Negative. Invalid Presence or absence of SARS CoV 2 nucleic acids cannot be determined. Repeat testing was performed on the submitted specimen and repeated Invalid results were obtained.  If clinically indicated, additional testing on a new specimen with an alternate test methodology [phone]) is advised.  The SARS CoV 2 RNA is generally detectable in upper and lower respiratory specimens during the acute phase of infection. The expected result is  Negative. Fact Sheet for Patients:  GolfingFamily.no Fact Sheet for Healthcare Providers: https://www.hernandez-brewer.com/ This test is not yet approved or cleared by the [place] FDA and has been authorized for detection and/or diagnosis of SARS CoV 2 by FDA under an Emergency Use Authorization (EUA).  This EU A will remain in effect (meaning this test can be used) for the duration of the COVID19 declaration under Section 564(b)(1) of the Act, 21 U.S.C. section [phone] 3(b)(1), unless the authorization is terminated or revoked sooner. Performed at [facility], [address] [place], [place] [address]   Blood culture (routine x 2)     Status: None (Preliminary result)   Collection Time: 02/14/19  2:00 PM   Specimen: BLOOD  Result Value Ref Range Status   Specimen Description   Final    BLOOD BLOOD RIGHT FOREARM Performed at [facility], [address] [place],  [address]    Special Requests   Final  BOTTLES DRAWN AEROBIC AND ANAEROBIC Blood Culture adequate volume Performed at [facility] [place], [address] [place], [place] [address]    Culture   Final    NO GROWTH 3 DAYS Performed at [facility], [address] [place], [place] [address]    Report Status PENDING  Incomplete    Radiology Reports Dg Chest Port 1 View  Result Date: 02/16/2019 CLINICAL DATA:  COVID-19 EXAM: PORTABLE CHEST 1 VIEW COMPARISON:  Yesterday FINDINGS: Subtle bilateral infiltrate. Normal heart size. Prominent upper mediastinal with likely from portable technique. The hila are negative. IMPRESSION: Subtle bilateral infiltrate.  No significant change from prior. Electronically Signed   By: Marnee SpringJonathon  Watts M.D.   On: 02/16/2019 08:37   Dg Chest Port 1 View  Result Date: 02/15/2019 CLINICAL DATA:  COVID-19 infection. EXAM: PORTABLE CHEST 1 VIEW COMPARISON:  Chest x-ray from yesterday. FINDINGS: The heart size and  mediastinal contours are within normal limits. Normal pulmonary vascularity. Minimal increased density at the right lung base. No focal consolidation, pleural effusion, or pneumothorax. No acute osseous abnormality. IMPRESSION: 1. Minimal increased density at the right lung base may reflect atelectasis, although early COVID-19 pneumonia could have a similar appearance. Electronically Signed   By: Obie DredgeWilliam T Derry M.D.   On: 02/15/2019 08:29   Dg Chest Portable 1 View  Result Date: 02/14/2019 CLINICAL DATA:  Cough, weakness for 2 days EXAM: PORTABLE CHEST 1 VIEW COMPARISON:  None. FINDINGS: The heart size and mediastinal contours are within normal limits. Both lungs are clear. The visualized skeletal structures are unremarkable. IMPRESSION: No active disease. Electronically Signed   By: Elige KoHetal  Patel   On: 02/14/2019 10:50

## 2019-02-17 NOTE — Progress Notes (Signed)
Pharmacy Brief Note  O:  ALT: 37 CXR: consistent with subtle infiltrate SpO2: < 94 % on RA  A/P:  Patient meets criteria for remdesevir therapy. Will initiate remdesivir 200 mg iv once followed by 100 mg iv daily x 4 days. Will f/u ALT.   Napoleon Form, [facility] 02/17/19 1:17 PM

## 2019-02-17 NOTE — Progress Notes (Signed)
CCMD notified this writer patient's oxygen sats have been maintaining in the  70's, patient advised he had gone to the bathroom "not too long ago, this Probation officer notice sensor was half off finger. New sensor applied patient immediately increased to 94% on room air.

## 2019-02-17 NOTE — Progress Notes (Signed)
02 sats 89% on room air

## 2019-02-17 NOTE — Progress Notes (Signed)
Inpatient Diabetes Program Recommendations  AACE/ADA: New Consensus Statement on Inpatient Glycemic Control (2015)  Target Ranges:  Prepandial:   less than 140 mg/dL      Peak postprandial:   less than 180 mg/dL (1-2 hours)      Critically ill patients:  140 - 180 mg/dL   Results for ZAXTON, ANGERER (MRN [id number]) as of 02/17/2019 15:43  Ref. Range 02/16/2019 08:27 02/16/2019 11:51 02/16/2019 16:28 02/16/2019 20:25 02/17/2019 08:30 02/17/2019 12:00  Glucose-Capillary Latest Ref Range: 70 - 99 mg/dL 163 (H) 159 (H) 203 (H) 267 (H) 203 (H) 287 (H)    Review of Glycemic Control  Diabetes history: DM 2 Outpatient Diabetes medications: Metformin 1000 mg bid, Tradjenta 5 mg Daily Current orders for Inpatient glycemic control: Lantus 20 units qhs, Novolog 0-20 units tid + 0-5 units qhs  Inpatient Diabetes Program Recommendations:    IV Solumedrol 60 mg Q8 hours.  Consider Lantus 26 units and Novolog 5 units tid meal coverage if patient is consuming at least 50% of meals.  Thanks,  Tama Headings RN, MSN, BC-ADM Inpatient Diabetes Coordinator Team Pager [phone] (8a-5p)

## 2019-02-18 LAB — COMPREHENSIVE METABOLIC PANEL
ALT: 58 U/L — ABNORMAL HIGH (ref 0–44)
AST: 37 U/L (ref 15–41)
Albumin: 3.2 g/dL — ABNORMAL LOW (ref 3.5–5.0)
Alkaline Phosphatase: 35 U/L — ABNORMAL LOW (ref 38–126)
Anion gap: 12 (ref 5–15)
BUN: 20 mg/dL (ref 6–20)
CO2: 22 mmol/L (ref 22–32)
Calcium: 9 mg/dL (ref 8.9–10.3)
Chloride: 104 mmol/L (ref 98–111)
Creatinine, Ser: 0.92 mg/dL (ref 0.61–1.24)
GFR calc Af Amer: >60 mL/min (ref >60)
GFR calc non Af Amer: >60 mL/min (ref >60)
Glucose, Bld: 311 mg/dL — ABNORMAL HIGH (ref 70–99)
Potassium: 4.5 mmol/L (ref 3.5–5.1)
Sodium: 138 mmol/L (ref 135–145)
Total Bilirubin: 0.3 mg/dL (ref 0.3–1.2)
Total Protein: 6.8 g/dL (ref 6.5–8.1)

## 2019-02-18 LAB — CBC WITH DIFFERENTIAL/PLATELET
Abs Immature Granulocytes: 0.04 10*3/uL (ref 0.00–0.07)
Basophils Absolute: 0 10*3/uL (ref 0.0–0.1)
Basophils Relative: 0 %
Eosinophils Absolute: 0 10*3/uL (ref 0.0–0.5)
Eosinophils Relative: 0 %
HCT: 42.7 % (ref 39.0–52.0)
Hemoglobin: 13.3 g/dL (ref 13.0–17.0)
Immature Granulocytes: 0 %
Lymphocytes Relative: 5 %
Lymphs Abs: 0.5 10*3/uL — ABNORMAL LOW (ref 0.7–4.0)
MCH: 26.7 pg (ref 26.0–34.0)
MCHC: 31.1 g/dL (ref 30.0–36.0)
MCV: 85.6 fL (ref 80.0–100.0)
Monocytes Absolute: 0.4 10*3/uL (ref 0.1–1.0)
Monocytes Relative: 4 %
Neutro Abs: 8.9 10*3/uL — ABNORMAL HIGH (ref 1.7–7.7)
Neutrophils Relative %: 91 %
Platelets: 213 10*3/uL (ref 150–400)
RBC: 4.99 MIL/uL (ref 4.22–5.81)
RDW: 15.4 % (ref 11.5–15.5)
WBC: 9.8 10*3/uL (ref 4.0–10.5)
nRBC: 0 % (ref 0.0–0.2)

## 2019-02-18 LAB — GLUCOSE, CAPILLARY
Glucose-Capillary: 268 mg/dL — ABNORMAL HIGH (ref 70–99)
Glucose-Capillary: 335 mg/dL — ABNORMAL HIGH (ref 70–99)
Glucose-Capillary: 324 mg/dL — ABNORMAL HIGH (ref 70–99)
Glucose-Capillary: 309 mg/dL — ABNORMAL HIGH (ref 70–99)

## 2019-02-18 LAB — C-REACTIVE PROTEIN: CRP: 10 mg/dL — ABNORMAL HIGH (ref <1.0)

## 2019-02-18 LAB — FERRITIN: Ferritin: 453 ng/mL — ABNORMAL HIGH (ref 24–336)

## 2019-02-18 LAB — D-DIMER, QUANTITATIVE: D-Dimer, Quant: 0.39 ug/mL-FEU (ref 0.00–0.50)

## 2019-02-18 MED ORDER — INSULIN ASPART 100 UNIT/ML ~~LOC~~ SOLN
4.0000 [IU] | Freq: Three times a day (TID) | SUBCUTANEOUS | Status: DC
  Administered 2019-02-18 – 2019-02-21 (×10): 4 [IU] via SUBCUTANEOUS

## 2019-02-18 MED ORDER — AMLODIPINE BESYLATE 5 MG PO TABS
5.0000 mg | ORAL_TABLET | Freq: Every day | ORAL | Status: DC
  Administered 2019-02-18 – 2019-02-21 (×4): 5 mg via ORAL
  Filled 2019-02-18 (×4): qty 1

## 2019-02-18 MED ORDER — INSULIN GLARGINE 100 UNIT/ML ~~LOC~~ SOLN
25.0000 [IU] | Freq: Every day | SUBCUTANEOUS | Status: DC
  Administered 2019-02-18: 25 [IU] via SUBCUTANEOUS
  Filled 2019-02-18: qty 0.25

## 2019-02-18 NOTE — Plan of Care (Signed)
Progressing as planned

## 2019-02-18 NOTE — Progress Notes (Signed)
PROGRESS NOTE                                                                                                                                                                                                             Patient Demographics:    Marcus Fritz, is a 58 y.o. male, DOB - 02-24-1961, AYT:[id number]  Outpatient Primary MD for the patient is System, Provider Not In    LOS - 4  Chief Complaint  Patient presents with  . Fever       Brief Narrative: Patient is a 58 y.o. male with PMHx of DM, HTN, obesity who presented to [facility] with a few days history of fever, fatigue, diarrhea, in the emergency room he was found to have hypotension and AKI.  Patient was resuscitated with IV fluids-he was found to be COVID-19 positive-and admitted to the hospitalist service.  See below for further details   Subjective:    Marcus Fritz feels slightly better-no fever-no shortness of breath.  Continues to cough.   Assessment  & Plan :   Gastroenteritis: Secondary to COVID 19-GI symptoms have markedly improved-no diarrhea-tolerating diet.  Continue supportive care.  Acute hypoxemic respiratory failure secondary to COVID-19 pneumonia: Improved-stable on 1-2 L of oxygen with O2 saturations in the mid to high 90s.  Inflammatory markers have started to downtrend-remains on steroids-continue Remdesivir-we will plan on a 5-day course.    COVID-19 Labs:  Recent Labs    02/16/19 0440 02/17/19 0430 02/18/19 0300  DDIMER 0.85* 0.81* 0.39  FERRITIN 424* 517* 453*  CRP 22.8* 19.2* 10.0*    Lab Results  Component Value Date   SARSCOV2NAA POSITIVE (A) 02/14/2019   SARSCOV2NAA DETECTED (A) 02/14/2019     COVID-19 Medications: 6/24>> IV Solu-Medrol 6/25>> Remdesivir  Acute kidney injury: Likely hemodynamically mediated-in the setting of diarrhea-resolved.  Follow electrolytes periodically  DM-2: CBGs still on the higher  side-likely secondary to steroids-increase Lantus to 25 units, add NovoLog 4 units with meals, continue SSI.  Follow and adjust accordingly.    HTN: Blood pressure starting to creep up-start amlodipine 5 mg p.o. daily and follow.    GERD: Continue PPI  Condition - Stable  Family Communication  :  Sister updated over the phone on 6/26  Code Status :  Full Code  Diet :  Diet Order  Diet Carb Modified Fluid consistency: Thin; Room service appropriate? Yes  Diet effective now               Disposition Plan  :  Remain inpatient  DVT Prophylaxis  :  Lovenox   Lab Results  Component Value Date   PLT 213 02/18/2019    Inpatient Medications  Scheduled Meds: . enoxaparin (LOVENOX) injection  0.5 mg/kg Subcutaneous Q24H  . famotidine  20 mg Oral BID  . insulin aspart  0-20 Units Subcutaneous TID WC  . insulin aspart  0-5 Units Subcutaneous QHS  . insulin glargine  20 Units Subcutaneous QHS  . methylPREDNISolone (SOLU-MEDROL) injection  60 mg Intravenous Q8H  . pantoprazole  40 mg Oral Daily   Continuous Infusions: . sodium chloride 10 mL/hr at 02/17/19 0612  . remdesivir 100 mg in NS 250 mL     PRN Meds:.acetaminophen, albuterol, bisacodyl, hydrOXYzine, loperamide, ondansetron **OR** ondansetron (ZOFRAN) IV, senna-docusate, traMADol  Antibiotics  :    Anti-infectives (From admission, onward)   Start     Dose/Rate Route Frequency Ordered Stop   02/18/19 1500  remdesivir 100 mg in sodium chloride 0.9 % 250 mL IVPB     100 mg 500 mL/hr over 30 Minutes Intravenous Every 24 hours 02/17/19 1305 02/22/19 1459   02/17/19 1500  remdesivir 200 mg in sodium chloride 0.9 % 250 mL IVPB     200 mg 500 mL/hr over 30 Minutes Intravenous Once 02/17/19 1305 02/17/19 1845   02/14/19 1430  vancomycin (VANCOCIN) IVPB 1000 mg/200 mL premix     1,000 mg 200 mL/hr over 60 Minutes Intravenous Every 1 hr x 2 02/14/19 1332 02/14/19 1659   02/14/19 1330  ceFEPIme (MAXIPIME) 2 g in  sodium chloride 0.9 % 100 mL IVPB     2 g 200 mL/hr over 30 Minutes Intravenous  Once 02/14/19 1329 02/14/19 1447   02/14/19 1330  vancomycin (VANCOCIN) 2,000 mg in sodium chloride 0.9 % 500 mL IVPB  Status:  Discontinued     2,000 mg 250 mL/hr over 120 Minutes Intravenous  Once 02/14/19 1329 02/14/19 1537   02/14/19 1330  ceFEPIme (MAXIPIME) 2 g injection    Note to Pharmacy: Lucendia HerrlichAdkins, Andrea   : cabinet override      02/14/19 1330 02/14/19 1348       Time Spent in minutes  25   Jeoffrey MassedShanker Terry Bolotin M.D on 02/18/2019 at 10:46 AM  To page go to [email or web address] - use universal password  [facility] -  Office  [phone]   Admit date - 02/14/2019    4    Objective:   Vitals:   02/17/19 1711 02/17/19 2023 02/18/19 0350 02/18/19 0800  BP: (!) 147/88 (!) 156/87 (!) 157/87 (!) 159/90  Pulse: 73 80 73   Resp: 19 (!) 24 (!) 27   Temp:  98.3 F (36.8 C) 98.3 F (36.8 C) 98.2 F (36.8 C)  TempSrc:  Oral Oral Oral  SpO2: 97% 95% 97%   Weight:      Height:        Wt Readings from Last 3 Encounters:  02/14/19 (!) 154 kg  09/30/12 (!) 147.4 kg     Intake/Output Summary (Last 24 hours) at 02/18/2019 1046 Last data filed at 02/18/2019 0800 Gross per 24 hour  Intake 1550 ml  Output 2800 ml  Net -1250 ml     Physical Exam General appearance:Awake, alert, not in any distress.  Eyes:no scleral icterus. HEENT: Atraumatic and Normocephalic Neck:  supple, no JVD. Resp:Good air entry bilaterally,no rales or rhonchi CVS: S1 S2 regular, no murmurs.  GI: Bowel sounds present, Non tender and not distended with no gaurding, rigidity or rebound. Extremities: B/L Lower Ext shows no edema, both legs are warm to touch Neurology:  Non focal Psychiatric: Normal judgment and insight. Normal mood. Musculoskeletal:No digital cyanosis Skin:No Rash, warm and dry Wounds:N/A   Data Review:    CBC Recent Labs  Lab 02/14/19 1323 02/15/19 0405 02/16/19 0440 02/17/19 0430 02/18/19  0300  WBC 14.2* 8.8 6.3 3.7* 9.8  HGB 13.9 13.5 13.2 12.7* 13.3  HCT 45.0 44.6 43.2 41.4 42.7  PLT 205 210 174 183 213  MCV 85.1 86.1 85.5 85.2 85.6  MCH 26.3 26.1 26.1 26.1 26.7  MCHC 30.9 30.3 30.6 30.7 31.1  RDW 16.1* 16.2* 15.9* 15.4 15.4  LYMPHSABS 0.5* 0.7 0.9 0.4* 0.5*  MONOABS 1.1* 0.4 0.3 0.2 0.4  EOSABS 0.0 0.0 0.0 0.0 0.0  BASOSABS 0.0 0.0 0.0 0.0 0.0    Chemistries  Recent Labs  Lab 02/14/19 1323 02/15/19 0405 02/16/19 0440 02/17/19 0430 02/18/19 0300  NA 133* 138 138 136 138  K 4.2 3.5 3.9 4.5 4.5  CL 101 101 102 103 104  CO2 20* 24 25 24 22   GLUCOSE 237* 164* 149* 268* 311*  BUN 27* 18 15 14 20   CREATININE 2.49* 1.10 1.10 0.89 0.92  CALCIUM 9.0 9.1 8.8* 8.8* 9.0  AST  --  21 25 26  37  ALT  --  30 35 37 58*  ALKPHOS  --  34* 31* 30* 35*  BILITOT  --  0.5 0.6 0.3 0.3   ------------------------------------------------------------------------------------------------------------------ No results for input(s): CHOL, HDL, LDLCALC, TRIG, CHOLHDL, LDLDIRECT in the last 72 hours.  No results found for: HGBA1C ------------------------------------------------------------------------------------------------------------------ No results for input(s): TSH, T4TOTAL, T3FREE, THYROIDAB in the last 72 hours.  Invalid input(s): FREET3 ------------------------------------------------------------------------------------------------------------------ Recent Labs    02/17/19 0430 02/18/19 0300  FERRITIN 517* 453*    Coagulation profile No results for input(s): INR, PROTIME in the last 168 hours.  Recent Labs    02/17/19 0430 02/18/19 0300  DDIMER 0.81* 0.39    Cardiac Enzymes No results for input(s): CKMB, TROPONINI, MYOGLOBIN in the last 168 hours.  Invalid input(s): CK ------------------------------------------------------------------------------------------------------------------ No results found for: BNP  Micro Results Recent Results (from the past  240 hour(s))  Novel Coronavirus,NAA,(SEND-OUT TO REF LAB - TAT 24-48 hrs); Hosp Order     Status: Abnormal   Collection Time: 02/14/19 10:37 AM   Specimen: Nasopharyngeal Swab; Respiratory  Result Value Ref Range Status   SARS-CoV-2, NAA DETECTED (A) NOT DETECTED Final    Comment: (NOTE) Testing was performed using the cobas(R) SARS-CoV-2 test. This test was developed and its performance characteristics determined by [facility]. This test has not been FDA cleared or approved. This test has been authorized by FDA under an Emergency Use Authorization (EUA). This test is only authorized for the duration of time the declaration that circumstances exist justifying the authorization of the emergency use of in vitro diagnostic tests for detection of SARS-CoV-2 virus and/or diagnosis of COVID-19 infection under section 564(b)(1) of the Act, 21 U.S.C. 130QMV-7(Q)(4360bbb-3(b)(1), unless the authorization is terminated or revoked sooner. When diagnostic testing is negative, the possibility of a false negative result should be considered in the context of a patient's recent exposures and the presence of clinical signs and symptoms consistent with COVID-19. An individual without symptoms of COVID-19 and who is not shedding SARS-CoV-2  virus would expect to have  a negative (not detected) result in this assay. Performed At: [facility] [address] [place], [place] [id number] Jolene Schimke MD NW:[id number]    Coronavirus Source NASOPHARYNGEAL  Final    Comment: Performed at [facility], [address] [place], [place] [address]  Blood culture (routine x 2)     Status: None (Preliminary result)   Collection Time: 02/14/19  1:23 PM   Specimen: BLOOD  Result Value Ref Range Status   Specimen Description   Final    BLOOD RIGHT ANTECUBITAL Performed at [facility], [address] [place], [place] [address]    Special Requests   Final    BOTTLES DRAWN AEROBIC AND  ANAEROBIC Blood Culture adequate volume Performed at [facility], [address] [place], [place] [address]    Culture   Final    NO GROWTH 4 DAYS Performed at [facility], [address] [place], [place] [address]    Report Status PENDING  Incomplete  SARS Coronavirus 2 (Hosp order,Performed in [facility] lab via [facility] ID)     Status: Abnormal   Collection Time: 02/14/19  1:53 PM   Specimen: Dry Nasal Swab ([facility] ID Now)  Result Value Ref Range Status   SARS Coronavirus 2 ([employer] ID Now) POSITIVE (A) NEGATIVE Final    Comment: RESULT CALLED TO, READ BACK BY AND VERIFIED WITH: AMY HARTLEY RN  02/14/2019 OLSONM (NOTE) Interpretive Result Comment(s): COVID 19 Positive SARS CoV 2 target nucleic acids are DETECTED. The SARS CoV 2 RNA is generally detectable in upper and lower respiratory specimens during the acute phase of infection.  Positive results are indicative of active infection with SARS CoV 2.  Clinical correlation with patient history and other diagnostic information is necessary to determine patient infection status.  Positive results do not rule out bacterial infection or coinfection with other viruses. The expected result is Negative. COVID 19 Negative SARS CoV 2 target nucleic acids are NOT DETECTED. The SARS CoV 2 RNA is generally detectable in upper and lower respiratory specimens during the acute phase of infection.  Negative results do not preclude SARS CoV 2 infection, do not rule out coinfections with other pathogens, and should not be used as the sole basis for treatme nt or other patient management decisions.  Negative results must be combined with clinical observations, patient history, and epidemiological information. The expected result is Negative. Invalid Presence or absence of SARS CoV 2 nucleic acids cannot be determined. Repeat testing was performed on the submitted specimen and repeated Invalid results were obtained.  If  clinically indicated, additional testing on a new specimen with an alternate test methodology [phone]) is advised.  The SARS CoV 2 RNA is generally detectable in upper and lower respiratory specimens during the acute phase of infection. The expected result is Negative. Fact Sheet for Patients:  http://www.graves-ford.org/ Fact Sheet for Healthcare Providers: En[employer]roConcern.si This test is not yet approved or cleared by the [place] FDA and has been authorized for detection and/or diagnosis of SARS CoV 2 by FDA under an Emergency Use Authorization (EUA).  This EU A will remain in effect (meaning this test can be used) for the duration of the COVID19 declaration under Section 564(b)(1) of the Act, 21 U.S.C. section [phone] 3(b)(1), unless the authorization is terminated or revoked sooner. Performed at [facility], [address] [place], [place] [address]   Blood  culture (routine x 2)     Status: None (Preliminary result)   Collection Time: 02/14/19  2:00 PM   Specimen: BLOOD  Result Value Ref Range Status   Specimen Description   Final    BLOOD BLOOD RIGHT FOREARM Performed at [facility], [address] [place], [place] [address]    Special Requests   Final    BOTTLES DRAWN AEROBIC AND ANAEROBIC Blood Culture adequate volume Performed at [facility], [address] [place], [place] [address]    Culture   Final    NO GROWTH 4 DAYS Performed at [facility], [address] [place], [place] [address]    Report Status PENDING  Incomplete    Radiology Reports Dg Chest Port 1 View  Result Date: 02/16/2019 CLINICAL DATA:  COVID-19 EXAM: PORTABLE CHEST 1 VIEW COMPARISON:  Yesterday FINDINGS: Subtle bilateral infiltrate. Normal heart size. Prominent upper mediastinal with likely from portable technique. The hila are negative. IMPRESSION: Subtle bilateral infiltrate.  No significant change from  prior. Electronically Signed   By: Marnee SpringJonathon  Watts M.D.   On: 02/16/2019 08:37   Dg Chest Port 1 View  Result Date: 02/15/2019 CLINICAL DATA:  COVID-19 infection. EXAM: PORTABLE CHEST 1 VIEW COMPARISON:  Chest x-ray from yesterday. FINDINGS: The heart size and mediastinal contours are within normal limits. Normal pulmonary vascularity. Minimal increased density at the right lung base. No focal consolidation, pleural effusion, or pneumothorax. No acute osseous abnormality. IMPRESSION: 1. Minimal increased density at the right lung base may reflect atelectasis, although early COVID-19 pneumonia could have a similar appearance. Electronically Signed   By: Obie DredgeWilliam T Derry M.D.   On: 02/15/2019 08:29   Dg Chest Portable 1 View  Result Date: 02/14/2019 CLINICAL DATA:  Cough, weakness for 2 days EXAM: PORTABLE CHEST 1 VIEW COMPARISON:  None. FINDINGS: The heart size and mediastinal contours are within normal limits. Both lungs are clear. The visualized skeletal structures are unremarkable. IMPRESSION: No active disease. Electronically Signed   By: Elige KoHetal  Patel   On: 02/14/2019 10:50

## 2019-02-18 NOTE — Progress Notes (Signed)
[place] for Lovenox  Indication: VTE prophylaxis  Allergies  Allergen Reactions  . Peanuts [Peanut Oil] Rash    Pt states this is an incorrect allergy entry in his chart. 02-14-19    Patient Measurements: Height: 6\' 1"  (185.4 cm) Weight: (!) 339 lb 8.1 oz (154 kg) IBW/kg (Calculated) : 79.9  Vital Signs: Temp: 98.3 F (36.8 C) (06/26 0350) Temp Source: Oral (06/26 0350) BP: 157/87 (06/26 0350) Pulse Rate: 73 (06/26 0350)  Labs: Recent Labs    02/16/19 0440 02/17/19 0430 02/18/19 0300  HGB 13.2 12.7* 13.3  HCT 43.2 41.4 42.7  PLT 174 183 213  CREATININE 1.10 0.89 0.92    Estimated Creatinine Clearance: 135.6 mL/min (by C-G formula based on SCr of 0.92 mg/dL).   Medical History: Past Medical History:  Diagnosis Date  . Diabetes mellitus without complication ([place])   . Hyperlipidemia   . Hypertension    Assessment: 58 y/o M w/ COVID-19 gastroenteritis and possible developing pulmonary infiltrate. Pharmacy consulted to dose Lovenox for VTE prophylaxis. H/H and Plt wnl. SCr remains stable.   BMI= 44  Goal of Therapy:  Monitor platelets by anticoagulation protocol: Yes   Plan:  - Continue Lovenox 75 mg daily (0.5 mg/kg/day). - Will follow renal function and CBC - Monitor for bleeding   Albertina Parr, PharmD., BCPS Clinical Pharmacist Clinical phone for 02/18/19 until 5pm: [phone]

## 2019-02-19 LAB — COMPREHENSIVE METABOLIC PANEL
ALT: 61 U/L — ABNORMAL HIGH (ref 0–44)
AST: 25 U/L (ref 15–41)
Albumin: 3.3 g/dL — ABNORMAL LOW (ref 3.5–5.0)
Alkaline Phosphatase: 37 U/L — ABNORMAL LOW (ref 38–126)
Anion gap: 9 (ref 5–15)
BUN: 21 mg/dL — ABNORMAL HIGH (ref 6–20)
CO2: 28 mmol/L (ref 22–32)
Calcium: 9.4 mg/dL (ref 8.9–10.3)
Chloride: 103 mmol/L (ref 98–111)
Creatinine, Ser: 0.91 mg/dL (ref 0.61–1.24)
GFR calc Af Amer: >60 mL/min (ref >60)
GFR calc non Af Amer: >60 mL/min (ref >60)
Glucose, Bld: 294 mg/dL — ABNORMAL HIGH (ref 70–99)
Potassium: 4.8 mmol/L (ref 3.5–5.1)
Sodium: 140 mmol/L (ref 135–145)
Total Bilirubin: 0.3 mg/dL (ref 0.3–1.2)
Total Protein: 7 g/dL (ref 6.5–8.1)

## 2019-02-19 LAB — CBC WITH DIFFERENTIAL/PLATELET
Abs Immature Granulocytes: 0.07 10*3/uL (ref 0.00–0.07)
Basophils Absolute: 0 10*3/uL (ref 0.0–0.1)
Basophils Relative: 0 %
Eosinophils Absolute: 0 10*3/uL (ref 0.0–0.5)
Eosinophils Relative: 0 %
HCT: 42.6 % (ref 39.0–52.0)
Hemoglobin: 12.9 g/dL — ABNORMAL LOW (ref 13.0–17.0)
Immature Granulocytes: 1 %
Lymphocytes Relative: 6 %
Lymphs Abs: 0.6 10*3/uL — ABNORMAL LOW (ref 0.7–4.0)
MCH: 26 pg (ref 26.0–34.0)
MCHC: 30.3 g/dL (ref 30.0–36.0)
MCV: 85.7 fL (ref 80.0–100.0)
Monocytes Absolute: 0.6 10*3/uL (ref 0.1–1.0)
Monocytes Relative: 6 %
Neutro Abs: 9 10*3/uL — ABNORMAL HIGH (ref 1.7–7.7)
Neutrophils Relative %: 87 %
Platelets: 227 10*3/uL (ref 150–400)
RBC: 4.97 MIL/uL (ref 4.22–5.81)
RDW: 15.5 % (ref 11.5–15.5)
WBC: 10.2 10*3/uL (ref 4.0–10.5)
nRBC: 0 % (ref 0.0–0.2)

## 2019-02-19 LAB — CULTURE, BLOOD (ROUTINE X 2)
Culture: NO GROWTH
Culture: NO GROWTH
Special Requests: ADEQUATE
Special Requests: ADEQUATE

## 2019-02-19 LAB — GLUCOSE, CAPILLARY
Glucose-Capillary: 250 mg/dL — ABNORMAL HIGH (ref 70–99)
Glucose-Capillary: 411 mg/dL — ABNORMAL HIGH (ref 70–99)
Glucose-Capillary: 354 mg/dL — ABNORMAL HIGH (ref 70–99)
Glucose-Capillary: 256 mg/dL — ABNORMAL HIGH (ref 70–99)

## 2019-02-19 LAB — C-REACTIVE PROTEIN: CRP: 4.3 mg/dL — ABNORMAL HIGH (ref <1.0)

## 2019-02-19 LAB — D-DIMER, QUANTITATIVE: D-Dimer, Quant: 0.29 ug/mL-FEU (ref 0.00–0.50)

## 2019-02-19 LAB — FERRITIN: Ferritin: 370 ng/mL — ABNORMAL HIGH (ref 24–336)

## 2019-02-19 MED ORDER — INSULIN GLARGINE 100 UNIT/ML ~~LOC~~ SOLN
30.0000 [IU] | Freq: Every day | SUBCUTANEOUS | Status: DC
  Administered 2019-02-19 – 2019-02-20 (×2): 30 [IU] via SUBCUTANEOUS
  Filled 2019-02-19 (×2): qty 0.3

## 2019-02-19 NOTE — Progress Notes (Signed)
The pt's Sister called and was provided a daily update regarding the pt's current status.

## 2019-02-19 NOTE — Progress Notes (Signed)
Patient alert and oriented, patient to update family over the phone.

## 2019-02-19 NOTE — Progress Notes (Signed)
PROGRESS NOTE                                                                                                                                                                                                             Patient Demographics:    Marcus Fritz, is a 57 y.o. male, DOB - 1961/01/10, ZOX:[id number]  Outpatient Primary MD for the patient is System, Provider Not In    LOS - 5  Chief Complaint  Patient presents with  . Fever       Brief Narrative: Patient is a 58 y.o. male with PMHx of DM, HTN, obesity who presented to [facility] with a few days history of fever, fatigue, diarrhea, in the emergency room he was found to have hypotension and AKI.  Patient was resuscitated with IV fluids-he was found to be COVID-19 positive-and admitted to the hospitalist service.  See below for further details   Subjective:    Marcus Fritz feels much better-and is asking when he can go home   Assessment  & Plan :   Gastroenteritis: Secondary to COVID 19-GI symptoms have markedly improved-no diarrhea-tolerating diet.  Continue supportive care.  Acute hypoxemic respiratory failure secondary to COVID-19 pneumonia: Continues to improve-continues to have coughing spells but shortness of breath is much better.  On room air this morning with O2 saturation in the high 90s.  Inflammatory markers downtrending.  Has completed a course of steroids-we will complete Remdesivir on 6/29.  COVID-19 Labs:  Recent Labs    02/17/19 0430 02/18/19 0300 02/19/19 0515  DDIMER 0.81* 0.39 0.29  FERRITIN 517* 453* 370*  CRP 19.2* 10.0* 4.3*    Lab Results  Component Value Date   SARSCOV2NAA POSITIVE (A) 02/14/2019   SARSCOV2NAA DETECTED (A) 02/14/2019     COVID-19 Medications: 6/24>> IV Solu-Medrol 6/25>> Remdesivir  Acute kidney injury: Likely hemodynamically mediated-in the setting of diarrhea-resolved.  Follow electrolytes periodically   DM-2: CBGs still on the higher side-likely secondary to steroids-Lantus just increased to 25 units yesterday-we will watch for another day before adjusting.  Continue with SSI.   HTN: Blood pressure to be stable-continue amlodipine that was restarted yesterday.    GERD: Continue PPI  Condition - Stable  Family Communication  : Called patient's sister-unable to reach her-unable to leave a voicemail.    Code Status :  Full Code  Diet :  Diet Order            Diet Carb Modified Fluid consistency: Thin; Room service appropriate? Yes  Diet effective now               Disposition Plan  :  Remain inpatient-home on 6/29  DVT Prophylaxis  :  Lovenox   Lab Results  Component Value Date   PLT 227 02/19/2019    Inpatient Medications  Scheduled Meds: . amLODipine  5 mg Oral Daily  . enoxaparin (LOVENOX) injection  0.5 mg/kg Subcutaneous Q24H  . famotidine  20 mg Oral BID  . insulin aspart  0-20 Units Subcutaneous TID WC  . insulin aspart  0-5 Units Subcutaneous QHS  . insulin aspart  4 Units Subcutaneous TID WC  . insulin glargine  25 Units Subcutaneous QHS  . pantoprazole  40 mg Oral Daily   Continuous Infusions: . sodium chloride 10 mL/hr at 02/17/19 0612  . remdesivir 100 mg in NS 250 mL 100 mg (02/18/19 1529)   PRN Meds:.acetaminophen, albuterol, bisacodyl, hydrOXYzine, loperamide, ondansetron **OR** ondansetron (ZOFRAN) IV, senna-docusate, traMADol  Antibiotics  :    Anti-infectives (From admission, onward)   Start     Dose/Rate Route Frequency Ordered Stop   02/18/19 1500  remdesivir 100 mg in sodium chloride 0.9 % 250 mL IVPB     100 mg 500 mL/hr over 30 Minutes Intravenous Every 24 hours 02/17/19 1305 02/22/19 1459   02/17/19 1500  remdesivir 200 mg in sodium chloride 0.9 % 250 mL IVPB     200 mg 500 mL/hr over 30 Minutes Intravenous Once 02/17/19 1305 02/17/19 1845   02/14/19 1430  vancomycin (VANCOCIN) IVPB 1000 mg/200 mL premix     1,000 mg 200 mL/hr over  60 Minutes Intravenous Every 1 hr x 2 02/14/19 1332 02/14/19 1659   02/14/19 1330  ceFEPIme (MAXIPIME) 2 g in sodium chloride 0.9 % 100 mL IVPB     2 g 200 mL/hr over 30 Minutes Intravenous  Once 02/14/19 1329 02/14/19 1447   02/14/19 1330  vancomycin (VANCOCIN) 2,000 mg in sodium chloride 0.9 % 500 mL IVPB  Status:  Discontinued     2,000 mg 250 mL/hr over 120 Minutes Intravenous  Once 02/14/19 1329 02/14/19 1537   02/14/19 1330  ceFEPIme (MAXIPIME) 2 g injection    Note to Pharmacy: Lucendia HerrlichAdkins, Andrea   : cabinet override      02/14/19 1330 02/14/19 1348       Time Spent in minutes  25   Jeoffrey MassedShanker Ghimire M.D on 02/19/2019 at 11:44 AM  To page go to [email or web address] - use universal password  [facility] -  Office  [phone]   Admit date - 02/14/2019    5    Objective:   Vitals:   02/18/19 1600 02/18/19 2115 02/19/19 0457 02/19/19 0753  BP:  (!) 154/81 (!) 149/98 (!) 157/89  Pulse: 68 69 (!) 57 (!) 59  Resp:  (!) 30 (!) 24 16  Temp: 98.1 F (36.7 C) 97.8 F (36.6 C) 97.7 F (36.5 C) 97.9 F (36.6 C)  TempSrc: Oral Oral Oral Oral  SpO2:  95% 95% 98%  Weight:      Height:        Wt Readings from Last 3 Encounters:  02/14/19 (!) 154 kg  09/30/12 (!) 147.4 kg     Intake/Output Summary (Last 24 hours) at 02/19/2019 1144 Last data filed at 02/19/2019 0600 Gross per 24 hour  Intake  683.29 ml  Output 1000 ml  Net -316.71 ml     Physical Exam General appearance:Awake, alert, not in any distress.  Eyes:no scleral icterus. HEENT: Atraumatic and Normocephalic Neck: supple, no JVD. Resp:Good air entry bilaterally,no rales or rhonchi CVS: S1 S2 regular, no murmurs.  GI: Bowel sounds present, Non tender and not distended with no gaurding, rigidity or rebound. Extremities: B/L Lower Ext shows no edema, both legs are warm to touch Neurology:  Non focal Psychiatric: Normal judgment and insight. Normal mood. Musculoskeletal:No digital cyanosis Skin:No Rash, warm and  dry Wounds:N/A   Data Review:    CBC Recent Labs  Lab 02/15/19 0405 02/16/19 0440 02/17/19 0430 02/18/19 0300 02/19/19 0515  WBC 8.8 6.3 3.7* 9.8 10.2  HGB 13.5 13.2 12.7* 13.3 12.9*  HCT 44.6 43.2 41.4 42.7 42.6  PLT 210 174 183 213 227  MCV 86.1 85.5 85.2 85.6 85.7  MCH 26.1 26.1 26.1 26.7 26.0  MCHC 30.3 30.6 30.7 31.1 30.3  RDW 16.2* 15.9* 15.4 15.4 15.5  LYMPHSABS 0.7 0.9 0.4* 0.5* 0.6*  MONOABS 0.4 0.3 0.2 0.4 0.6  EOSABS 0.0 0.0 0.0 0.0 0.0  BASOSABS 0.0 0.0 0.0 0.0 0.0    Chemistries  Recent Labs  Lab 02/15/19 0405 02/16/19 0440 02/17/19 0430 02/18/19 0300 02/19/19 0515  NA 138 138 136 138 140  K 3.5 3.9 4.5 4.5 4.8  CL 101 102 103 104 103  CO2 24 25 24 22 28   GLUCOSE 164* 149* 268* 311* 294*  BUN 18 15 14 20  21*  CREATININE 1.10 1.10 0.89 0.92 0.91  CALCIUM 9.1 8.8* 8.8* 9.0 9.4  AST 21 25 26  37 25  ALT 30 35 37 58* 61*  ALKPHOS 34* 31* 30* 35* 37*  BILITOT 0.5 0.6 0.3 0.3 0.3   ------------------------------------------------------------------------------------------------------------------ No results for input(s): CHOL, HDL, LDLCALC, TRIG, CHOLHDL, LDLDIRECT in the last 72 hours.  No results found for: HGBA1C ------------------------------------------------------------------------------------------------------------------ No results for input(s): TSH, T4TOTAL, T3FREE, THYROIDAB in the last 72 hours.  Invalid input(s): FREET3 ------------------------------------------------------------------------------------------------------------------ Recent Labs    02/18/19 0300 02/19/19 0515  FERRITIN 453* 370*    Coagulation profile No results for input(s): INR, PROTIME in the last 168 hours.  Recent Labs    02/18/19 0300 02/19/19 0515  DDIMER 0.39 0.29    Cardiac Enzymes No results for input(s): CKMB, TROPONINI, MYOGLOBIN in the last 168 hours.  Invalid input(s): CK  ------------------------------------------------------------------------------------------------------------------ No results found for: BNP  Micro Results Recent Results (from the past 240 hour(s))  Novel Coronavirus,NAA,(SEND-OUT TO REF LAB - TAT 24-48 hrs); Hosp Order     Status: Abnormal   Collection Time: 02/14/19 10:37 AM   Specimen: Nasopharyngeal Swab; Respiratory  Result Value Ref Range Status   SARS-CoV-2, NAA DETECTED (A) NOT DETECTED Final    Comment: (NOTE) Testing was performed using the cobas(R) SARS-CoV-2 test. This test was developed and its performance characteristics determined by [employer]. This test has not been FDA cleared or approved. This test has been authorized by FDA under an Emergency Use Authorization (EUA). This test is only authorized for the duration of time the declaration that circumstances exist justifying the authorization of the emergency use of in vitro diagnostic tests for detection of SARS-CoV-2 virus and/or diagnosis of COVID-19 infection under section 564(b)(1) of the Act, 21 U.S.C. 778EUM-3(N)(3), unless the authorization is terminated or revoked sooner. When diagnostic testing is negative, the possibility of a false negative result should be considered in the context of a patient's recent  exposures and the presence of clinical signs and symptoms consistent with COVID-19. An individual without symptoms of COVID-19 and who is not shedding SARS-CoV-2 virus would expect to have  a negative (not detected) result in this assay. Performed At: [facility] [address] [place], [place] [id number] Jolene SchimkeNagendra Sanjai MD WU:[id number]:[phone]    Coronavirus Source NASOPHARYNGEAL  Final    Comment: Performed at [facility] [place], [address] [place], [place] [address]  Blood culture (routine x 2)     Status: None   Collection Time: 02/14/19  1:23 PM   Specimen: BLOOD  Result Value Ref Range Status   Specimen Description   Final     BLOOD RIGHT ANTECUBITAL Performed at [facility] [place], [address] [place], [place] [address]    Special Requests   Final    BOTTLES DRAWN AEROBIC AND ANAEROBIC Blood Culture adequate volume Performed at [facility] [place], [address] [place], [place] [address]    Culture   Final    NO GROWTH 5 DAYS Performed at [facility], [address] [place], [place] [address]    Report Status 02/19/2019 FINAL  Final  SARS Coronavirus 2 (Hosp order,Performed in [facility] via [employer] ID)     Status: Abnormal   Collection Time: 02/14/19  1:53 PM   Specimen: Dry Nasal Swab (A[employer]ott ID Now)  Result Value Ref Range Status   SARS Coronavirus 2 ([employer] Now) POSITIVE (A) NEGATIVE Final    Comment: RESULT CALLED TO, READ BACK BY AND VERIFIED WITH: AMY HARTLEY RN @1416  02/14/2019 OLSONM (NOTE) Interpretive Result Comment(s): COVID 19 Positive SARS CoV 2 target nucleic acids are DETECTED. The SARS CoV 2 RNA is generally detectable in upper and lower respiratory specimens during the acute phase of infection.  Positive results are indicative of active infection with SARS CoV 2.  Clinical correlation with patient history and other diagnostic information is necessary to determine patient infection status.  Positive results do not rule out bacterial infection or coinfection with other viruses. The expected result is Negative. COVID 19 Negative SARS CoV 2 target nucleic acids are NOT DETECTED. The SARS CoV 2 RNA is generally detectable in upper and lower respiratory specimens during the acute phase of infection.  Negative results do not preclude SARS CoV 2 infection, do not rule out coinfections with other pathogens, and should not be used as the sole basis for treatme nt or other patient management decisions.  Negative results must be combined with clinical observations, patient history, and epidemiological information. The expected result is Negative. Invalid  Presence or absence of SARS CoV 2 nucleic acids cannot be determined. Repeat testing was performed on the submitted specimen and repeated Invalid results were obtained.  If clinically indicated, additional testing on a new specimen with an alternate test methodology [phone]([id number]) is advised.  The SARS CoV 2 RNA is generally detectable in upper and lower respiratory specimens during the acute phase of infection. The expected result is Negative. Fact Sheet for Patients:  http://www.graves-ford.org/https://www.fda.gov/media/136524/download Fact Sheet for Healthcare Providers: EnviroConcern.sihttps://www.fda.gov/media/136523/download This test is not yet approved or cleared by the [place] FDA and has been authorized for detection and/or diagnosis of SARS CoV 2 by FDA under an Emergency Use Authorization (EUA).  This EU A will remain in effect (meaning this test can be used) for the duration of the COVID19 declaration under Section 564(b)(1) of the Act, 21 U.S.C. section 539 333 [id number] 3(b)(1), unless the  authorization is terminated or revoked sooner. Performed at [facility] [place], [address] [place], [place] [address]   Blood culture (routine x 2)     Status: None   Collection Time: 02/14/19  2:00 PM   Specimen: BLOOD  Result Value Ref Range Status   Specimen Description   Final    BLOOD BLOOD RIGHT FOREARM Performed at [facility] [place], [address] [place], [place] [address]    Special Requests   Final    BOTTLES DRAWN AEROBIC AND ANAEROBIC Blood Culture adequate volume Performed at [facility] [place], [address] [place], [place] [address]    Culture   Final    NO GROWTH 5 DAYS Performed at [facility], [address] [place], [place] [address]    Report Status 02/19/2019 FINAL  Final    Radiology Reports Dg Chest Port 1 View  Result Date: 02/16/2019 CLINICAL DATA:  COVID-19 EXAM: PORTABLE CHEST 1 VIEW COMPARISON:  Yesterday FINDINGS: Subtle bilateral infiltrate. Normal heart size.  Prominent upper mediastinal with likely from portable technique. The hila are negative. IMPRESSION: Subtle bilateral infiltrate.  No significant change from prior. Electronically Signed   By: Marnee SpringJonathon  Watts M.D.   On: 02/16/2019 08:37   Dg Chest Port 1 View  Result Date: 02/15/2019 CLINICAL DATA:  COVID-19 infection. EXAM: PORTABLE CHEST 1 VIEW COMPARISON:  Chest x-ray from yesterday. FINDINGS: The heart size and mediastinal contours are within normal limits. Normal pulmonary vascularity. Minimal increased density at the right lung base. No focal consolidation, pleural effusion, or pneumothorax. No acute osseous abnormality. IMPRESSION: 1. Minimal increased density at the right lung base may reflect atelectasis, although early COVID-19 pneumonia could have a similar appearance. Electronically Signed   By: Obie DredgeWilliam T Derry M.D.   On: 02/15/2019 08:29   Dg Chest Portable 1 View  Result Date: 02/14/2019 CLINICAL DATA:  Cough, weakness for 2 days EXAM: PORTABLE CHEST 1 VIEW COMPARISON:  None. FINDINGS: The heart size and mediastinal contours are within normal limits. Both lungs are clear. The visualized skeletal structures are unremarkable. IMPRESSION: No active disease. Electronically Signed   By: Elige KoHetal  Patel   On: 02/14/2019 10:50

## 2019-02-19 NOTE — Progress Notes (Addendum)
CRITICAL VALUE ALERT  Critical Value: CBG 411  Date & Time Notied: 02/16/19 1217  Provider Notified: Ghimire  Orders Received/Actions taken: No new orders give sliding scale and meal coverage

## 2019-02-20 LAB — GLUCOSE, CAPILLARY
Glucose-Capillary: 124 mg/dL — ABNORMAL HIGH (ref 70–99)
Glucose-Capillary: 244 mg/dL — ABNORMAL HIGH (ref 70–99)
Glucose-Capillary: 202 mg/dL — ABNORMAL HIGH (ref 70–99)
Glucose-Capillary: 193 mg/dL — ABNORMAL HIGH (ref 70–99)

## 2019-02-20 MED ORDER — ALUM & MAG HYDROXIDE-SIMETH 200-200-20 MG/5ML PO SUSP
30.0000 mL | ORAL | Status: DC | PRN
  Administered 2019-02-20 (×2): 30 mL via ORAL
  Filled 2019-02-20 (×2): qty 30

## 2019-02-20 NOTE — Progress Notes (Signed)
Patient alert and oriented, patient to update sister.

## 2019-02-20 NOTE — Plan of Care (Signed)

## 2019-02-20 NOTE — Progress Notes (Addendum)
PROGRESS NOTE                                                                                                                                                                                                             Patient Demographics:    Marcus Fritz, is a 58 y.o. male, DOB - 1960-10-21, EAV:[id number]  Outpatient Primary MD for the patient is System, Provider Not In    LOS - 6  Chief Complaint  Patient presents with  . Fever       Brief Narrative: Patient is a 58 y.o. male with PMHx of DM, HTN, obesity who presented to [facility] with a few days history of fever, fatigue, diarrhea, in the emergency room he was found to have hypotension and AKI.  Patient was resuscitated with IV fluids-he was found to be COVID-19 positive-and admitted to the hospitalist service.  See below for further details   Subjective:    Marcus Fritz continues to improve-now on RA-cough improving.   Assessment  & Plan :   Gastroenteritis: Secondary to COVID 19-GI symptoms have markedly improved-no diarrhea-tolerating diet.  Continue supportive care.  Acute hypoxemic respiratory failure secondary to COVID-19 pneumonia: Continues to improve-continues to have coughing spells but shortness of breath is much better.  On room air this morning with O2 saturation in the high 90s.  Inflammatory markers downtrending.  Has completed a course of steroids-we will complete Remdesivir on 6/29.  COVID-19 Labs:  Recent Labs    02/18/19 0300 02/19/19 0515  DDIMER 0.39 0.29  FERRITIN 453* 370*  CRP 10.0* 4.3*    Lab Results  Component Value Date   SARSCOV2NAA POSITIVE (A) 02/14/2019   SARSCOV2NAA DETECTED (A) 02/14/2019     COVID-19 Medications: 6/24>> IV Solu-Medrol 6/25>> Remdesivir  Acute kidney injury: Likely hemodynamically mediated-in the setting of diarrhea-resolved.  Follow electrolytes periodically  DM-2: CBGs stable this morning.  Continue Lantus 25 units and SSI.   HTN: Controlled-continue Amlodipine  GERD: Continue PPI  Condition - Stable  Family Communication  : Spoke with sister over the phone on 6/28  Code Status :  Full Code  Diet :  Diet Order            Diet Carb Modified Fluid consistency: Thin; Room service appropriate? Yes  Diet effective now  Disposition Plan  :  Remain inpatient-home on 6/29  DVT Prophylaxis  :  Lovenox   Lab Results  Component Value Date   PLT 227 02/19/2019    Inpatient Medications  Scheduled Meds: . amLODipine  5 mg Oral Daily  . enoxaparin (LOVENOX) injection  0.5 mg/kg Subcutaneous Q24H  . famotidine  20 mg Oral BID  . insulin aspart  0-20 Units Subcutaneous TID WC  . insulin aspart  0-5 Units Subcutaneous QHS  . insulin aspart  4 Units Subcutaneous TID WC  . insulin glargine  30 Units Subcutaneous QHS  . pantoprazole  40 mg Oral Daily   Continuous Infusions: . sodium chloride Stopped (02/19/19 1500)  . remdesivir 100 mg in NS 250 mL Stopped (02/19/19 1454)   PRN Meds:.acetaminophen, albuterol, alum & mag hydroxide-simeth, bisacodyl, hydrOXYzine, loperamide, ondansetron **OR** ondansetron (ZOFRAN) IV, senna-docusate, traMADol  Antibiotics  :    Anti-infectives (From admission, onward)   Start     Dose/Rate Route Frequency Ordered Stop   02/18/19 1500  remdesivir 100 mg in sodium chloride 0.9 % 250 mL IVPB     100 mg 500 mL/hr over 30 Minutes Intravenous Every 24 hours 02/17/19 1305 02/22/19 1459   02/17/19 1500  remdesivir 200 mg in sodium chloride 0.9 % 250 mL IVPB     200 mg 500 mL/hr over 30 Minutes Intravenous Once 02/17/19 1305 02/17/19 1845   02/14/19 1430  vancomycin (VANCOCIN) IVPB 1000 mg/200 mL premix     1,000 mg 200 mL/hr over 60 Minutes Intravenous Every 1 hr x 2 02/14/19 1332 02/14/19 1659   02/14/19 1330  ceFEPIme (MAXIPIME) 2 g in sodium chloride 0.9 % 100 mL IVPB     2 g 200 mL/hr over 30 Minutes Intravenous  Once  02/14/19 1329 02/14/19 1447   02/14/19 1330  vancomycin (VANCOCIN) 2,000 mg in sodium chloride 0.9 % 500 mL IVPB  Status:  Discontinued     2,000 mg 250 mL/hr over 120 Minutes Intravenous  Once 02/14/19 1329 02/14/19 1537   02/14/19 1330  ceFEPIme (MAXIPIME) 2 g injection    Note to Pharmacy: Lucendia HerrlichAdkins, Andrea   : cabinet override      02/14/19 1330 02/14/19 1348       Time Spent in minutes  25   Jeoffrey MassedShanker  M.D on 02/20/2019 at 11:06 AM  To page go to [email or web address] - use universal password  [facility] -  Office  [phone]   Admit date - 02/14/2019    6    Objective:   Vitals:   02/19/19 2200 02/19/19 2225 02/20/19 0420 02/20/19 0727  BP:  (!) 143/79 (!) 149/83 138/87  Pulse:  85 65 74  Resp:  (!) 25 (!) 22 (!) 27  Temp: 98.2 F (36.8 C)  99 F (37.2 C) 98.8 F (37.1 C)  TempSrc: Oral  Oral Oral  SpO2:  95% 97% 94%  Weight:      Height:        Wt Readings from Last 3 Encounters:  02/14/19 (!) 154 kg  09/30/12 (!) 147.4 kg     Intake/Output Summary (Last 24 hours) at 02/20/2019 1106 Last data filed at 02/20/2019 0700 Gross per 24 hour  Intake 971.23 ml  Output 1500 ml  Net -528.77 ml     Physical Exam General appearance:Awake, alert, not in any distress.  Eyes:no scleral icterus. HEENT: Atraumatic and Normocephalic Neck: supple, no JVD. Resp:Good air entry bilaterally,no rales or rhonchi CVS: S1 S2 regular, no murmurs.  GI: Bowel sounds present, Non tender and not distended with no gaurding, rigidity or rebound. Extremities: B/L Lower Ext shows no edema, both legs are warm to touch Neurology:  Non focal Psychiatric: Normal judgment and insight. Normal mood. Musculoskeletal:No digital cyanosis Skin:No Rash, warm and dry Wounds:N/A  Data Review:    CBC Recent Labs  Lab 02/15/19 0405 02/16/19 0440 02/17/19 0430 02/18/19 0300 02/19/19 0515  WBC 8.8 6.3 3.7* 9.8 10.2  HGB 13.5 13.2 12.7* 13.3 12.9*  HCT 44.6 43.2 41.4 42.7 42.6   PLT 210 174 183 213 227  MCV 86.1 85.5 85.2 85.6 85.7  MCH 26.1 26.1 26.1 26.7 26.0  MCHC 30.3 30.6 30.7 31.1 30.3  RDW 16.2* 15.9* 15.4 15.4 15.5  LYMPHSABS 0.7 0.9 0.4* 0.5* 0.6*  MONOABS 0.4 0.3 0.2 0.4 0.6  EOSABS 0.0 0.0 0.0 0.0 0.0  BASOSABS 0.0 0.0 0.0 0.0 0.0    Chemistries  Recent Labs  Lab 02/15/19 0405 02/16/19 0440 02/17/19 0430 02/18/19 0300 02/19/19 0515  NA 138 138 136 138 140  K 3.5 3.9 4.5 4.5 4.8  CL 101 102 103 104 103  CO2 GLUCOSE 164* 149* 268* 311* 294*  BUN 21*  CREATININE 1.10 1.10 0.89 0.92 0.91  CALCIUM 9.1 8.8* 8.8* 9.0 9.4  AST 37 25  ALT 30 35 37 58* 61*  ALKPHOS 34* 31* 30* 35* 37*  BILITOT 0.5 0.6 0.3 0.3 0.3   ------------------------------------------------------------------------------------------------------------------ No results for input(s): CHOL, HDL, LDLCALC, TRIG, CHOLHDL, LDLDIRECT in the last 72 hours.  No results found for: HGBA1C ------------------------------------------------------------------------------------------------------------------ No results for input(s): TSH, T4TOTAL, T3FREE, THYROIDAB in the last 72 hours.  Invalid input(s): FREET3 ------------------------------------------------------------------------------------------------------------------ Recent Labs    02/18/19 0300 02/19/19 0515  FERRITIN 453* 370*    Coagulation profile No results for input(s): INR, PROTIME in the last 168 hours.  Recent Labs    02/18/19 0300 02/19/19 0515  DDIMER 0.39 0.29    Cardiac Enzymes No results for input(s): CKMB, TROPONINI, MYOGLOBIN in the last 168 hours.  Invalid input(s): CK ------------------------------------------------------------------------------------------------------------------ No results found for: BNP  Micro Results Recent Results (from the past 240 hour(s))  Novel Coronavirus,NAA,(SEND-OUT TO REF LAB - TAT 24-48 hrs); Hosp Order     Status: Abnormal    Collection Time: 02/14/19 10:37 AM   Specimen: Nasopharyngeal Swab; Respiratory  Result Value Ref Range Status   SARS-CoV-2, NAA DETECTED (A) NOT DETECTED Final    Comment: (NOTE) Testing was performed using the cobas(R) SARS-CoV-2 test. This test was developed and its performance characteristics determined by [employer]. This test has not been FDA cleared or approved. This test has been authorized by FDA under an Emergency Use Authorization (EUA). This test is only authorized for the duration of time the declaration that circumstances exist justifying the authorization of the emergency use of in vitro diagnostic tests for detection of SARS-CoV-2 virus and/or diagnosis of COVID-19 infection under section 564(b)(1) of the Act, 21 U.S.C. 161WRU-0(A)(5), unless the authorization is terminated or revoked sooner. When diagnostic testing is negative, the possibility of a false negative result should be considered in the context of a patient's recent exposures and the presence of clinical signs and symptoms consistent with COVID-19. An individual without symptoms of COVID-19 and who is not shedding SARS-CoV-2 virus would expect to have  a negative (not detected) result in this assay. Performed At: [facility] [address] [place], [place] [id number] [place] Riley  Derinda Late MD [id number]    Coronavirus Source NASOPHARYNGEAL  Final    Comment: Performed at [facility], [address] [place], [place] [address]  Blood culture (routine x 2)     Status: None   Collection Time: 02/14/19  1:23 PM   Specimen: BLOOD  Result Value Ref Range Status   Specimen Description   Final    BLOOD RIGHT ANTECUBITAL Performed at [facility], [address] [place], [place] [address]    Special Requests   Final    BOTTLES DRAWN AEROBIC AND ANAEROBIC Blood Culture adequate volume Performed at [facility], [address] [place], [place] [address]     Culture   Final    NO GROWTH 5 DAYS Performed at [facility], [place] [address], [place], [place] [address]    Report Status 02/19/2019 FINAL  Final  SARS Coronavirus 2 (Hosp order,Performed in [facility] lab via [employer] ID)     Status: Abnormal   Collection Time: 02/14/19  1:53 PM   Specimen: Dry Nasal Swab ([employer]bbott ID Now)  Result Value Ref Range Status   SARS Coronavirus 2 (Abbott ID Now) POSITIVE (A) NEGATIVE Final    Comment: RESULT CALLED TO, READ BACK BY AND VERIFIED WITH: AMY HARTLEY RN @1416  02/14/2019 OLSONM (NOTE) Interpretive Result Comment(s): COVID 19 Positive SARS CoV 2 target nucleic acids are DETECTED. The SARS CoV 2 RNA is generally detectable in upper and lower respiratory specimens during the acute phase of infection.  Positive results are indicative of active infection with SARS CoV 2.  Clinical correlation with patient history and other diagnostic information is necessary to determine patient infection status.  Positive results do not rule out bacterial infection or coinfection with other viruses. The expected result is Negative. COVID 19 Negative SARS CoV 2 target nucleic acids are NOT DETECTED. The SARS CoV 2 RNA is generally detectable in upper and lower respiratory specimens during the acute phase of infection.  Negative results do not preclude SARS CoV 2 infection, do not rule out coinfections with other pathogens, and should not be used as the sole basis for treatme nt or other patient management decisions.  Negative results must be combined with clinical observations, patient history, and epidemiological information. The expected result is Negative. Invalid Presence or absence of SARS CoV 2 nucleic acids cannot be determined. Repeat testing was performed on the submitted specimen and repeated Invalid results were obtained.  If clinically indicated, additional testing on a new specimen with an alternate test methodology [phone]) is advised.  The  SARS CoV 2 RNA is generally detectable in upper and lower respiratory specimens during the acute phase of infection. The expected result is Negative. Fact Sheet for Patients:  GolfingFamily.no Fact Sheet for Healthcare Providers: https://www.hernandez-brewer.com/ This test is not yet approved or cleared by the [place] FDA and has been authorized for detection and/or diagnosis of SARS CoV 2 by FDA under an Emergency Use Authorization (EUA).  This EU A will remain in effect (meaning this test can be used) for the duration of the COVID19 declaration under Section 564(b)(1) of the Act, 21 U.S.C. section [phone] 3(b)(1), unless the authorization is terminated or revoked sooner. Performed at [facility], [address] [place], [place] [address]   Blood culture (routine x 2)     Status: None   Collection Time: 02/14/19  2:00 PM   Specimen: BLOOD  Result Value Ref Range  Status   Specimen Description   Final    BLOOD BLOOD RIGHT FOREARM Performed at [facility], [address] [place], [place] [address]    Special Requests   Final    BOTTLES DRAWN AEROBIC AND ANAEROBIC Blood Culture adequate volume Performed at [facility], [address] [place], [place] [address]    Culture   Final    NO GROWTH 5 DAYS Performed at [facility], [address] [place], [place] [address]    Report Status 02/19/2019 FINAL  Final    Radiology Reports Dg Chest Port 1 View  Result Date: 02/16/2019 CLINICAL DATA:  COVID-19 EXAM: PORTABLE CHEST 1 VIEW COMPARISON:  Yesterday FINDINGS: Subtle bilateral infiltrate. Normal heart size. Prominent upper mediastinal with likely from portable technique. The hila are negative. IMPRESSION: Subtle bilateral infiltrate.  No significant change from prior. Electronically Signed   By: Marnee SpringJonathon  Watts M.D.   On: 02/16/2019 08:37   Dg Chest Port 1 View  Result Date: 02/15/2019 CLINICAL  DATA:  COVID-19 infection. EXAM: PORTABLE CHEST 1 VIEW COMPARISON:  Chest x-ray from yesterday. FINDINGS: The heart size and mediastinal contours are within normal limits. Normal pulmonary vascularity. Minimal increased density at the right lung base. No focal consolidation, pleural effusion, or pneumothorax. No acute osseous abnormality. IMPRESSION: 1. Minimal increased density at the right lung base may reflect atelectasis, although early COVID-19 pneumonia could have a similar appearance. Electronically Signed   By: Obie DredgeWilliam T Derry M.D.   On: 02/15/2019 08:29   Dg Chest Portable 1 View  Result Date: 02/14/2019 CLINICAL DATA:  Cough, weakness for 2 days EXAM: PORTABLE CHEST 1 VIEW COMPARISON:  None. FINDINGS: The heart size and mediastinal contours are within normal limits. Both lungs are clear. The visualized skeletal structures are unremarkable. IMPRESSION: No active disease. Electronically Signed   By: Elige KoHetal  Patel   On: 02/14/2019 10:50

## 2019-02-21 DIAGNOSIS — J9601 Acute respiratory failure with hypoxia: Secondary | ICD-10-CM

## 2019-02-21 DIAGNOSIS — N179 Acute kidney failure, unspecified: Secondary | ICD-10-CM

## 2019-02-21 LAB — GLUCOSE, CAPILLARY
Glucose-Capillary: 124 mg/dL — ABNORMAL HIGH (ref 70–99)
Glucose-Capillary: 134 mg/dL — ABNORMAL HIGH (ref 70–99)

## 2019-02-21 MED ORDER — BENZONATATE 100 MG PO CAPS: 100.0000 mg | ORAL_CAPSULE | Freq: Four times a day (QID) | ORAL | 0 refills | Status: DC | PRN

## 2019-02-21 MED ORDER — ALBUTEROL SULFATE HFA 108 (90 BASE) MCG/ACT IN AERS: 1.0000 | INHALATION_SPRAY | Freq: Four times a day (QID) | RESPIRATORY_TRACT | 0 refills | Status: AC | PRN

## 2019-02-21 NOTE — Progress Notes (Signed)
Pt given discharge instruction.. Pt to be picked up by his sister at 1 PM at front of hospital. Pt to go home with his son. All education and AVS was given to Pt.

## 2019-02-21 NOTE — Progress Notes (Signed)
SATURATION QUALIFICATIONS: (This note is used to comply with regulatory documentation for home oxygen)  Patient Saturations on Room Air at Rest = 100%  Patient Saturations on Room Air while Ambulating = 95%  Patient Saturations on  Liters of oxygen while Ambulating = %NT Patient does not require supplemental Oxygen. [place] Pager [phone] Office [phone]

## 2019-02-21 NOTE — Progress Notes (Signed)
Feels much better-remains on room air-anxious to go home-stable for discharge-see discharge summary for details

## 2019-02-21 NOTE — Progress Notes (Signed)
Pt discharge home, Pt's sister picked up at front hospital Pt taken out via w/c

## 2019-02-21 NOTE — Progress Notes (Signed)
[place] for Lovenox  Indication: VTE prophylaxis  Allergies  Allergen Reactions  . Peanuts [Peanut Oil] Rash    Pt states this is an incorrect allergy entry in his chart. 02-14-19    Patient Measurements: Height: 6\' 1"  (185.4 cm) Weight: (!) 339 lb 8.1 oz (154 kg) IBW/kg (Calculated) : 79.9  Vital Signs: Temp: 98.8 F (37.1 C) (06/29 0801) Temp Source: Oral (06/29 0801) BP: 117/79 (06/29 0801) Pulse Rate: 89 (06/29 0800)  Labs: Recent Labs    02/19/19 0515  HGB 12.9*  HCT 42.6  PLT 227  CREATININE 0.91    Estimated Creatinine Clearance: 137 mL/min (by C-G formula based on SCr of 0.91 mg/dL).   Medical History: Past Medical History:  Diagnosis Date  . Diabetes mellitus without complication ([place])   . Hyperlipidemia   . Hypertension    Assessment: 58 y/o M w/ COVID-19 gastroenteritis and possible developing pulmonary infiltrate. Pharmacy consulted to dose Lovenox for VTE prophylaxis. H/H and Plt wnl. SCr remains stable. BMI= 44.  Goal of Therapy:  Monitor platelets by anticoagulation protocol: Yes   Plan:  - Continue Lovenox 75 mg daily (0.5 mg/kg/day). - Will follow renal function and CBC - Monitor for bleeding   Gretta Arab PharmD, BCPS Clinical Pharmacist Clinical pharmacist phone 7am- 5pm: [phone] 02/21/2019 11:32 AM

## 2019-02-21 NOTE — Plan of Care (Signed)
  Problem: Education: Goal: Knowledge of General Education information will improve Description: Including pain rating scale, medication(s)/side effects and non-pharmacologic comfort measures 02/21/2019 1236 by Shanon Ace, RN Outcome: Completed/Met 02/21/2019 0735 by Shanon Ace, RN Outcome: Progressing   Problem: Health Behavior/Discharge Planning: Goal: Ability to manage health-related needs will improve 02/21/2019 1236 by Shanon Ace, RN Outcome: Completed/Met 02/21/2019 0735 by Shanon Ace, RN Outcome: Progressing   Problem: Clinical Measurements: Goal: Ability to maintain clinical measurements within normal limits will improve 02/21/2019 1236 by Shanon Ace, RN Outcome: Completed/Met 02/21/2019 0735 by Shanon Ace, RN Outcome: Progressing Goal: Will remain free from infection 02/21/2019 1236 by Shanon Ace, RN Outcome: Completed/Met 02/21/2019 0735 by Shanon Ace, RN Outcome: Progressing Goal: Diagnostic test results will improve 02/21/2019 1236 by Shanon Ace, RN Outcome: Completed/Met 02/21/2019 0735 by Shanon Ace, RN Outcome: Progressing Goal: Respiratory complications will improve 02/21/2019 1236 by Shanon Ace, RN Outcome: Completed/Met 02/21/2019 0735 by Shanon Ace, RN Outcome: Progressing Goal: Cardiovascular complication will be avoided 02/21/2019 1236 by Shanon Ace, RN Outcome: Completed/Met 02/21/2019 [place] by Shanon Ace, RN Outcome: Progressing   Problem: Activity: Goal: Risk for activity intolerance will decrease 02/21/2019 1236 by Shanon Ace, RN Outcome: Completed/Met 02/21/2019 0735 by Shanon Ace, RN Outcome: Progressing   Problem: Nutrition: Goal: Adequate nutrition will be maintained 02/21/2019 1236 by Shanon Ace, RN Outcome: Completed/Met 02/21/2019 0735 by Shanon Ace, RN Outcome: Progressing   Problem: Coping: Goal: Level of anxiety will decrease 02/21/2019 1236 by Shanon Ace, RN Outcome:  Completed/Met 02/21/2019 0735 by Shanon Ace, RN Outcome: Progressing   Problem: Elimination: Goal: Will not experience complications related to bowel motility 02/21/2019 1236 by Shanon Ace, RN Outcome: Completed/Met 02/21/2019 0735 by Shanon Ace, RN Outcome: Progressing Goal: Will not experience complications related to urinary retention 02/21/2019 1236 by Shanon Ace, RN Outcome: Completed/Met 02/21/2019 0735 by Shanon Ace, RN Outcome: Progressing   Problem: Pain Managment: Goal: General experience of comfort will improve 02/21/2019 1236 by Shanon Ace, RN Outcome: Completed/Met 02/21/2019 0735 by Shanon Ace, RN Outcome: Progressing   Problem: Safety: Goal: Ability to remain free from injury will improve 02/21/2019 1236 by Shanon Ace, RN Outcome: Completed/Met 02/21/2019 0735 by Shanon Ace, RN Outcome: Progressing   Problem: Skin Integrity: Goal: Risk for impaired skin integrity will decrease 02/21/2019 1236 by Shanon Ace, RN Outcome: Completed/Met 02/21/2019 0735 by Shanon Ace, RN Outcome: Progressing   Problem: Education: Goal: Knowledge of risk factors and measures for prevention of condition will improve 02/21/2019 1236 by Shanon Ace, RN Outcome: Completed/Met 02/21/2019 0735 by Shanon Ace, RN Outcome: Progressing   Problem: Coping: Goal: Psychosocial and spiritual needs will be supported 02/21/2019 1236 by Shanon Ace, RN Outcome: Completed/Met 02/21/2019 0735 by Shanon Ace, RN Outcome: Progressing   Problem: Respiratory: Goal: Will maintain a patent airway 02/21/2019 1236 by Shanon Ace, RN Outcome: Completed/Met 02/21/2019 0735 by Shanon Ace, RN Outcome: Progressing Goal: Complications related to the disease process, condition or treatment will be avoided or minimized 02/21/2019 1236 by Shanon Ace, RN Outcome: Completed/Met 02/21/2019 [place] by Shanon Ace, RN Outcome: Progressing

## 2019-02-21 NOTE — Plan of Care (Signed)

## 2019-02-21 NOTE — Evaluation (Signed)
Physical Therapy Evaluation Patient Details Name: Marcus Fritz MRN: [name]4 DOB: [birth year] Today's Date: 02/21/2019   History of Present Illness  Patient is a 58 y.o. male with PMHx of DM, HTN, obesity who [facility]  with a few days history of fever, fatigue, diarrhea, in the emergency room he was found to have hypotension and AKI.  Patient was resuscitated with IV fluids-he was found to be COVID-19 positive-  Clinical Impression  \The patient is independent. Ambulated x 300' on RA with SaO2 > 92%. Plans Dc home. T signing off.    Follow Up Recommendations No PT follow up    Equipment Recommendations  None recommended by PT    Recommendations for Other Services       Precautions / Restrictions Precautions Precautions: None      Mobility  Bed Mobility Overal bed mobility: Independent                Transfers Overall transfer level: Independent                  Ambulation/Gait Ambulation/Gait assistance: Independent   Assistive device: None Gait Pattern/deviations: WFL(Within Functional Limits)        Stairs            Wheelchair Mobility    Modified Rankin (Stroke Patients Only)       Balance                                             Pertinent Vitals/Pain      Home Living Family/patient expects to be discharged to:: Private residence Living Arrangements: Children Available Help at Discharge: Family Type of Home: House Home Access: Level entry     Home Layout: Two level;Bed/bath upstairs Home Equipment: None      Prior Function Level of Independence: Independent               Hand Dominance        Extremity/Trunk Assessment   Upper Extremity Assessment Upper Extremity Assessment: Overall WFL for tasks assessed    Lower Extremity Assessment Lower Extremity Assessment: Overall WFL for tasks assessed    Cervical / Trunk Assessment Cervical / Trunk Assessment: Normal   Communication   Communication: No difficulties  Cognition Arousal/Alertness: Awake/alert Behavior During Therapy: WFL for tasks assessed/performed Overall Cognitive Status: Within Functional Limits for tasks assessed                                        General Comments      Exercises     Assessment/Plan    PT Assessment Patent does not need any further PT services  PT Problem List         PT Treatment Interventions      PT Goals (Current goals can be found in the Care Plan section)  Acute Rehab PT Goals Patient Stated Goal: go home PT Goal Formulation: All assessment and education complete, DC therapy    Frequency     Barriers to discharge        Co-evaluation               AM-PAC PT "6 Clicks" Mobility  Outcome Measure Help needed turning from your back to your side while in a flat bed  without using bedrails?: None Help needed moving from lying on your back to sitting on the side of a flat bed without using bedrails?: None Help needed moving to and from a bed to a chair (including a wheelchair)?: None Help needed standing up from a chair using your arms (e.g., wheelchair or bedside chair)?: None Help needed to walk in hospital room?: None Help needed climbing 3-5 steps with a railing? : None 6 Click Score: 24    End of Session   Activity Tolerance: Patient tolerated treatment well Patient left: in chair Nurse Communication: Mobility status PT Visit Diagnosis: Difficulty in walking, not elsewhere classified (R26.2)    Time: 7353-2992 PT Time Calculation (min) (ACUTE ONLY): 16 min   Charges:   PT Evaluation $PT Eval Low Complexity: [place] [facility] [phone] Office [phone]   Claretha Cooper 02/21/2019, 9:01 AM

## 2019-02-21 NOTE — Discharge Instructions (Signed)
Person Under Monitoring Name: Marcus Fritz  Location: [address] [place] [id number]   Record here the list of visitors to your home since you became ill with respiratory symptoms that led you to consult a health provider:  Visitor Name Date Time In Time Out Did this person come within 6 feet of you? Indicate Y or N Relationship to Person Under Monitoring Phone number Comments   ___/____/____ __:__ AM/PM __:__ AM/PM       ___/____/____ __:__ AM/PM __:__ AM/PM       ___/____/____ __:__ AM/PM __:__ AM/PM       ___/____/____ __:__ AM/PM __:__ AM/PM       ___/____/____ __:__ AM/PM __:__ AM/PM       ___/____/____ __:__ AM/PM __:__ AM/PM       ___/____/____ __:__ AM/PM __:__ AM/PM       ___/____/____ __:__ AM/PM __:__ AM/PM       ___/____/____ __:__ AM/PM __:__ AM/PM       ___/____/____ __:__ AM/PM __:__ AM/PM       ___/____/____ __:__ AM/PM __:__ AM/PM       ___/____/____ __:__ AM/PM __:__ AM/PM       ___/____/____ __:__ AM/PM __:__ AM/PM       ___/____/____ __:__ AM/PM __:__ AM/PM       [facility] DHHS, Division of Public Health, Communicable Disease Branch   Person Under Monitoring Name: Marcus Fritz  Location: [address] [place] [id number]   Infection Prevention Recommendations for Individuals Confirmed to have, or Being Evaluated for, 2019 Novel Coronavirus (COVID-19) Infection Who Receive Care at Home  Individuals who are confirmed to have, or are being evaluated for, COVID-19 should follow the prevention steps below until a healthcare provider or local or state health department says they can return to normal activities.  Stay home except to get medical care You should restrict activities outside your home, except for getting medical care. Do not go to work, school, or public areas, and do not use public transportation or taxis.  Call ahead before visiting your doctor Before your medical appointment, call the healthcare provider  and tell them that you have, or are being evaluated for, COVID-19 infection. This will help the healthcare providers office take steps to keep other people from getting infected. Ask your healthcare provider to call the local or state health department.  Monitor your symptoms Seek prompt medical attention if your illness is worsening (e.g., difficulty breathing). Before going to your medical appointment, call the healthcare provider and tell them that you have, or are being evaluated for, COVID-19 infection. Ask your healthcare provider to call the local or state health department.  Wear a facemask You should wear a facemask that covers your nose and mouth when you are in the same room with other people and when you visit a healthcare provider. People who live with or visit you should also wear a facemask while they are in the same room with you.  Separate yourself from other people in your home As much as possible, you should stay in a different room from other people in your home. Also, you should use a separate bathroom, if available.  Avoid sharing household items You should not share dishes, drinking glasses, cups, eating utensils, towels, bedding, or other items with other people in your home. After using these items, you should wash them thoroughly with soap and water.  Cover your coughs and sneezes Cover your mouth and nose with a  tissue when you cough or sneeze, or you can cough or sneeze into your sleeve. Throw used tissues in a lined trash can, and immediately wash your hands with soap and water for at least 20 seconds or use an alcohol-based hand rub.  Wash your [facility] your hands often and thoroughly with soap and water for at least 20 seconds. You can use an alcohol-based hand sanitizer if soap and water are not available and if your hands are not visibly dirty. Avoid touching your eyes, nose, and mouth with unwashed hands.   Prevention Steps for Caregivers and  Household Members of Individuals Confirmed to have, or Being Evaluated for, COVID-19 Infection Being Cared for in the Home  If you live with, or provide care at home for, a person confirmed to have, or being evaluated for, COVID-19 infection please follow these guidelines to prevent infection:  Follow healthcare providers instructions Make sure that you understand and can help the patient follow any healthcare provider instructions for all care.  Provide for the patients basic needs You should help the patient with basic needs in the home and provide support for getting groceries, prescriptions, and other personal needs.  Monitor the patients symptoms If they are getting sicker, call his or her medical provider and tell them that the patient has, or is being evaluated for, COVID-19 infection. This will help the healthcare providers office take steps to keep other people from getting infected. Ask the healthcare provider to call the local or state health department.  Limit the number of people who have contact with the patient  If possible, have only one caregiver for the patient.  Other household members should stay in another home or place of residence. If this is not possible, they should stay  in another room, or be separated from the patient as much as possible. Use a separate bathroom, if available.  Restrict visitors who do not have an essential need to be in the home.  Keep older adults, very young children, and other sick people away from the patient Keep older adults, very young children, and those who have compromised immune systems or chronic health conditions away from the patient. This includes people with chronic heart, lung, or kidney conditions, diabetes, and cancer.  Ensure good ventilation Make sure that shared spaces in the home have good air flow, such as from an air conditioner or an opened window, weather permitting.  Wash your hands often  Wash your  hands often and thoroughly with soap and water for at least 20 seconds. You can use an alcohol based hand sanitizer if soap and water are not available and if your hands are not visibly dirty.  Avoid touching your eyes, nose, and mouth with unwashed hands.  Use disposable paper towels to dry your hands. If not available, use dedicated cloth towels and replace them when they become wet.  Wear a facemask and gloves  Wear a disposable facemask at all times in the room and gloves when you touch or have contact with the patients blood, body fluids, and/or secretions or excretions, such as sweat, saliva, sputum, nasal mucus, vomit, urine, or feces.  Ensure the mask fits over your nose and mouth tightly, and do not touch it during use.  Throw out disposable facemasks and gloves after using them. Do not reuse.  Wash your hands immediately after removing your facemask and gloves.  If your personal clothing becomes contaminated, carefully remove clothing and launder. Wash your hands after handling contaminated  clothing.  Place all used disposable facemasks, gloves, and other waste in a lined container before disposing them with other household waste.  Remove gloves and wash your hands immediately after handling these items.  Do not share dishes, glasses, or other household items with the patient  Avoid sharing household items. You should not share dishes, drinking glasses, cups, eating utensils, towels, bedding, or other items with a patient who is confirmed to have, or being evaluated for, COVID-19 infection.  After the person uses these items, you should wash them thoroughly with soap and water.  Wash laundry thoroughly  Immediately remove and wash clothes or bedding that have blood, body fluids, and/or secretions or excretions, such as sweat, saliva, sputum, nasal mucus, vomit, urine, or feces, on them.  Wear gloves when handling laundry from the patient.  Read and follow directions on  labels of laundry or clothing items and detergent. In general, wash and dry with the warmest temperatures recommended on the label.  Clean all areas the individual has used often  Clean all touchable surfaces, such as counters, tabletops, doorknobs, bathroom fixtures, toilets, phones, keyboards, tablets, and bedside tables, every day. Also, clean any surfaces that may have blood, body fluids, and/or secretions or excretions on them.  Wear gloves when cleaning surfaces the patient has come in contact with.  Use a diluted bleach solution (e.g., dilute bleach with 1 part bleach and 10 parts water) or a household disinfectant with a label that says EPA-registered for coronaviruses. To make a bleach solution at home, add 1 tablespoon of bleach to 1 quart (4 cups) of water. For a larger supply, add  cup of bleach to 1 gallon (16 cups) of water.  Read labels of cleaning products and follow recommendations provided on product labels. Labels contain instructions for safe and effective use of the cleaning product including precautions you should take when applying the product, such as wearing gloves or eye protection and making sure you have good ventilation during use of the product.  Remove gloves and wash hands immediately after cleaning.  Monitor yourself for signs and symptoms of illness Caregivers and household members are considered close contacts, should monitor their health, and will be asked to limit movement outside of the home to the extent possible. Follow the monitoring steps for close contacts listed on the symptom monitoring form.   ? If you have additional questions, contact your local health department or call the epidemiologist on call at [phone] (available 24/7). ? This guidance is subject to change. For the most up-to-date guidance from [facility], please refer to their website: YouBlogs.pl'      Person Under  Monitoring Name: Marcus Fritz  Location: [place] [address]   CORONAVIRUS DISEASE 2019 (COVID-19) Guidance for Persons Under Investigation You are being tested for the virus that causes coronavirus disease 2019 (COVID-19). Public health actions are necessary to ensure protection of your health and the health of others, and to prevent further spread of infection. COVID-19 is caused by a virus that can cause symptoms, such as fever, cough, and shortness of breath. The primary transmission from person to person is by coughing or sneezing. On September 23, 2018, the [place] announced a [employer] Emergency of International Concern and on September 24, 2018 the U.S. Department of Health and Human Services declared a public health emergency. If the virus that causesCOVID-19 spreads in the community, it could have severe public health consequences.  As a person under investigation  for COVID-19, the [place] Department of Health and [employer], Division of [employer] advises you to adhere to the following guidance until your test results are reported to you. If your test result is positive, you will receive additional information from your provider and your local health department at that time.   Remain at home until you are cleared by your health provider or public health authorities.   Keep a log of visitors to your home using the form provided. Any visitors to your home must be aware of your isolation status.  If you plan to move to a new address or leave the county, notify the local health department in your county.  Call a doctor or seek care if you have an urgent medical need. Before seeking medical care, call ahead and get instructions from the provider before arriving at the medical office, clinic or hospital. Notify them that you are being tested for the virus that causes COVID-19 so arrangements can be made, as necessary, to prevent  transmission to others in the healthcare setting. Next, notify the local health department in your county.  If a medical emergency arises and you need to call 911, inform the first responders that you are being tested for the virus that causes COVID-19. Next, notify the local health department in your county.  Adhere to all guidance set forth by the [facility] of [employer] for [facility] of patients that is based on guidance from the Center for Disease Control and Prevention with suspected or confirmed COVID-19. It is provided with this guidance for Persons Under Investigation.  Your health and the health of our community are our top priorities. Public Health officials remain available to provide assistance and counseling to you about COVID-19 and compliance with this guidance.  Provider: ____________________________________________________________ Date: ______/_____/_________  By signing below, you acknowledge that you have read and agree to comply with this Guidance for Persons Under Investigation. ______________________________________________________________ Date: ______/_____/_________  WHO DO I CALL? You can find a list of local health departments here: http://dean.org/https://www.ncdhhs.gov/divisions/public-health/county-healthdepartments Health Department: ____________________________________________________________________ Contact Name: ________________________________________________________________________ Telephone: ___________________________________________________________________________  [place]  DHHS, Division of Public Health, Communicable Disease Branch COVID-19 Guidance for Persons Under Investigation October 30, 2018

## 2019-02-21 NOTE — Discharge Summary (Addendum)
PATIENT DETAILS Name: Marcus Fritz Age: 58 y.o. Sex: male Date of Birth: [birth year] MRN: [id number]. Admitting Physician: Lonia Blood, MD NWG:[id number], Provider Not In  Admit Date: 02/14/2019 Discharge date: 02/21/2019  Recommendations for Outpatient Follow-up:  1. Follow up with PCP in 1-2 weeks 2. Please obtain BMP/CBC in one week  Admitted From:  Home  Disposition: Home   Home Health: No  Equipment/Devices: None  Discharge Condition: Stable  CODE STATUS: FULL CODE  Diet recommendation:  Heart Healthy / Carb Modified   Brief Summary: See H&P, Labs, Consult and Test reports for all details in brief, Patient is a 58 y.o. male with PMHx of DM, HTN, obesity who presented to [facility] with a few days history of fever, fatigue, diarrhea, in the emergency room he was found to have hypotension and AKI.  Patient was resuscitated with IV fluids-he was found to be COVID-19 positive-and admitted to the hospitalist service.  He is was treated with supportive care for his gastroenteritis-following resolution of his gastroenteritis-patient developed cough, shortness of breath and was found to have pneumonia on chest x-ray. See below for further details  Brief Hospital Course: Gastroenteritis: Secondary to COVID 19-GI symptoms have markedly improved-no diarrhea-tolerating diet.  Managed with just supportive care  Acute hypoxemic respiratory failure secondary to COVID-19 pneumonia: Markedly better-did require oxygen supplementation during this hospital stay.  Did also have coughing spells.  Much better after treatment with Remdesivir and steroids.  Inflammatory markers have down trended.  He is on room air-coughing spells have markedly improved as well.  He will complete his Remdesivir course later today-following which he will be discharged home.    Acute kidney injury: Likely hemodynamically mediated-in the setting of diarrhea-resolved.  Follow electrolytes periodically  DM-2:  CBGs stable-managed with Lantus and SSI-we will resume oral hypoglycemic agents on discharge  HTN: Controlled-continue Amlodipine and losartan on discharge  GERD: Continue PPI  Procedures/Studies: None  Discharge Diagnoses:  Active Problems:   Sepsis (HCC)   [facility])   Discharge Instructions:  Activity:  As tolerated with Full fall precautions use walker/cane & assistance as needed   Discharge Instructions    Call MD for:  difficulty breathing, headache or visual disturbances   Complete by: As directed    Call MD for:  persistant dizziness or light-headedness   Complete by: As directed    Call MD for:  severe uncontrolled pain   Complete by: As directed    Call MD for:  temperature >100.4   Complete by: As directed    Diet - low sodium heart healthy   Complete by: As directed    Diet Carb Modified   Complete by: As directed    Discharge instructions   Complete by: As directed    Follow with Primary MD in 1 week  Please get a complete blood count and chemistry panel checked by your Primary MD at your next visit, and again as instructed by your Primary MD.  Get Medicines reviewed and adjusted: Please take all your medications with you for your next visit with your Primary MD  Laboratory/radiological data: Please request your Primary MD to go over all hospital tests and procedure/radiological results at the follow up, please ask your Primary MD to get all Hospital records sent to his/her office.  In some cases, they will be blood work, cultures and biopsy results pending at the time of your discharge. Please request that your primary care M.D. follows up on these results.  Also Note the  following: If you experience worsening of your admission symptoms, develop shortness of breath, life threatening emergency, suicidal or homicidal thoughts you must seek medical attention immediately by calling 911 or calling your MD immediately  if symptoms less severe.  You must  read complete instructions/literature along with all the possible adverse reactions/side effects for all the Medicines you take and that have been prescribed to you. Take any new Medicines after you have completely understood and accpet all the possible adverse reactions/side effects.   Do not drive when taking Pain medications or sleeping medications (Benzodaizepines)  Do not take more than prescribed Pain, Sleep and Anxiety Medications. It is not advisable to combine anxiety,sleep and pain medications without talking with your primary care practitioner  Special Instructions: If you have smoked or chewed Tobacco  in the last 2 yrs please stop smoking, stop any regular Alcohol  and or any Recreational drug use.  Wear Seat belts while driving.  Please note: You were cared for by a hospitalist during your hospital stay. Once you are discharged, your primary care physician will handle any further medical issues. Please note that NO REFILLS for any discharge medications will be authorized once you are discharged, as it is imperative that you return to your primary care physician (or establish a relationship with a primary care physician if you do not have one) for your post hospital discharge needs so that they can reassess your need for medications and monitor your lab values.  ?   Person Under Monitoring Name: Trevonn Hallum  Location: [address] [place] [address]   Infection Prevention Recommendations for Individuals Confirmed to have, or Being Evaluated for, 2019 Novel Coronavirus (COVID-19) Infection Who Receive Care at Home  Individuals who are confirmed to have, or are being evaluated for, COVID-19 should follow the prevention steps below until a healthcare provider or local or state health department says they can return to normal activities.  Stay home except to get medical care You should restrict activities outside your home, except for getting medical care. Do not  go to work, school, or public areas, and do not use public transportation or taxis.  Call ahead before visiting your doctor Before your medical appointment, call the healthcare provider and tell them that you have, or are being evaluated for, COVID-19 infection. This will help the healthcare provider's office take steps to keep other people from getting infected. Ask your healthcare provider to call the local or state health department.  Monitor your symptoms Seek prompt medical attention if your illness is worsening (e.g., difficulty breathing). Before going to your medical appointment, call the healthcare provider and tell them that you have, or are being evaluated for, COVID-19 infection. Ask your healthcare provider to call the local or state health department.  Wear a facemask You should wear a facemask that covers your nose and mouth when you are in the same room with other people and when you visit a healthcare provider. People who live with or visit you should also wear a facemask while they are in the same room with you.  Separate yourself from other people in your home As much as possible, you should stay in a different room from other people in your home. Also, you should use a separate bathroom, if available.  Avoid sharing household items You should not share dishes, drinking glasses, cups, eating utensils, towels, bedding, or other items with other people in your home. After using these items, you should wash them  thoroughly with soap and water.  Cover your coughs and sneezes Cover your mouth and nose with a tissue when you cough or sneeze, or you can cough or sneeze into your sleeve. Throw used tissues in a lined trash can, and immediately wash your hands with soap and water for at least 20 seconds or use an alcohol-based hand rub.  Wash your [employer]hands Wash your hands often and thoroughly with soap and water for at least 20 seconds. You can use an alcohol-based  hand sanitizer if soap and water are not available and if your hands are not visibly dirty. Avoid touching your eyes, nose, and mouth with unwashed hands.   Prevention Steps for Caregivers and Household Members of Individuals Confirmed to have, or Being Evaluated for, COVID-19 Infection Being Cared for in the Home  If you live with, or provide care at home for, a person confirmed to have, or being evaluated for, COVID-19 infection please follow these guidelines to prevent infection:  Follow healthcare provider's instructions Make sure that you understand and can help the patient follow any healthcare provider instructions for all care.  Provide for the patient's basic needs You should help the patient with basic needs in the home and provide support for getting groceries, prescriptions, and other personal needs.  Monitor the patient's symptoms If they are getting sicker, call his or her medical provider and tell them that the patient has, or is being evaluated for, COVID-19 infection. This will help the healthcare provider's office take steps to keep other people from getting infected. Ask the healthcare provider to call the local or state health department.  Limit the number of people who have contact with the patient If possible, have only one caregiver for the patient. Other household members should stay in another home or place of residence. If this is not possible, they should stay in another room, or be separated from the patient as much as possible. Use a separate bathroom, if available. Restrict visitors who do not have an essential need to be in the home.  Keep older adults, very young children, and other sick people away from the patient Keep older adults, very young children, and those who have compromised immune systems or chronic health conditions away from the patient. This includes people with chronic heart, lung, or kidney conditions, diabetes, and cancer.  Ensure good  ventilation Make sure that shared spaces in the home have good air flow, such as from an air conditioner or an opened window, weather permitting.  Wash your hands often Wash your hands often and thoroughly with soap and water for at least 20 seconds. You can use an alcohol based hand sanitizer if soap and water are not available and if your hands are not visibly dirty. Avoid touching your eyes, nose, and mouth with unwashed hands. Use disposable paper towels to dry your hands. If not available, use dedicated cloth towels and replace them when they become wet.  Wear a facemask and gloves Wear a disposable facemask at all times in the room and gloves when you touch or have contact with the patient's blood, body fluids, and/or secretions or excretions, such as sweat, saliva, sputum, nasal mucus, vomit, urine, or feces.  Ensure the mask fits over your nose and mouth tightly, and do not touch it during use. Throw out disposable facemasks and gloves after using them. Do not reuse. Wash your hands immediately after removing your facemask and gloves. If your personal clothing becomes contaminated, carefully remove clothing and  launder. Wash your hands after handling contaminated clothing. Place all used disposable facemasks, gloves, and other waste in a lined container before disposing them with other household waste. Remove gloves and wash your hands immediately after handling these items.  Do not share dishes, glasses, or other household items with the patient Avoid sharing household items. You should not share dishes, drinking glasses, cups, eating utensils, towels, bedding, or other items with a patient who is confirmed to have, or being evaluated for, COVID-19 infection. After the person uses these items, you should wash them thoroughly with soap and water.  Wash laundry thoroughly Immediately remove and wash clothes or bedding that have blood, body fluids, and/or secretions or excretions, such as  sweat, saliva, sputum, nasal mucus, vomit, urine, or feces, on them. Wear gloves when handling laundry from the patient. Read and follow directions on labels of laundry or clothing items and detergent. In general, wash and dry with the warmest temperatures recommended on the label.  Clean all areas the individual has used often Clean all touchable surfaces, such as counters, tabletops, doorknobs, bathroom fixtures, toilets, phones, keyboards, tablets, and bedside tables, every day. Also, clean any surfaces that may have blood, body fluids, and/or secretions or excretions on them. Wear gloves when cleaning surfaces the patient has come in contact with. Use a diluted bleach solution (e.g., dilute bleach with 1 part bleach and 10 parts water) or a household disinfectant with a label that says EPA-registered for coronaviruses. To make a bleach solution at home, add 1 tablespoon of bleach to 1 quart (4 cups) of water. For a larger supply, add  cup of bleach to 1 gallon (16 cups) of water. Read labels of cleaning products and follow recommendations provided on product labels. Labels contain instructions for safe and effective use of the cleaning product including precautions you should take when applying the product, such as wearing gloves or eye protection and making sure you have good ventilation during use of the product. Remove gloves and wash hands immediately after cleaning.  Monitor yourself for signs and symptoms of illness Caregivers and household members are considered close contacts, should monitor their health, and will be asked to limit movement outside of the home to the extent possible. Follow the monitoring steps for close contacts listed on the symptom monitoring form.   ? If you have additional questions, contact your local health department or call the epidemiologist on call at [phone] (available 24/7). ? This guidance is subject to change. For the most up-to-date guidance from  [facility], please refer to their website: TripMetro.huhttps://www.cdc.gov/coronavirus/2019-ncov/hcp/guidance-prevent-spread.html   Increase activity slowly   Complete by: As directed      Allergies as of 02/21/2019      Reactions   Peanuts [peanut Oil] Rash   Pt states this is an incorrect allergy entry in his chart. 02-14-19      Medication List    STOP taking these medications   predniSONE 10 MG tablet Commonly known as: DELTASONE     TAKE these medications   albuterol 108 (90 Base) MCG/ACT inhaler Commonly known as: VENTOLIN HFA Inhale 1-2 puffs into the lungs every 6 (six) hours as needed for wheezing or shortness of breath. What changed:   when to take this  reasons to take this   amLODipine 10 MG tablet Commonly known as: NORVASC Take 10 mg by mouth daily.   atorvastatin 40 MG tablet Commonly known as: LIPITOR Take 40 mg by mouth daily.   benzonatate 100 MG capsule  Commonly known as: Best boy Take 1 capsule (100 mg total) by mouth every 6 (six) hours as needed for cough.   EPINEPHrine 0.3 mg/0.3 mL Soaj injection Commonly known as: EPI-PEN Inject 1 application into the muscle once.   losartan 50 MG tablet Commonly known as: COZAAR Take 50 mg by mouth daily.   metFORMIN 1000 MG tablet Commonly known as: GLUCOPHAGE Take 1,000 mg by mouth 2 (two) times a day.   omeprazole 20 MG capsule Commonly known as: PRILOSEC Take 20 mg by mouth daily.   Tradjenta 5 MG Tabs tablet Generic drug: linagliptin Take 5 mg by mouth daily.      Follow-up Information    Primary Care MD. Schedule an appointment as soon as possible for a visit in 1 week(s).          Allergies  Allergen Reactions   Peanuts [Peanut Oil] Rash    Pt states this is an incorrect allergy entry in his chart. 02-14-19    Consultations:   None   Other Procedures/Studies: Dg Chest Port 1 View  Result Date: 02/16/2019 CLINICAL DATA:  COVID-19 EXAM: PORTABLE CHEST 1 VIEW COMPARISON:  Yesterday  FINDINGS: Subtle bilateral infiltrate. Normal heart size. Prominent upper mediastinal with likely from portable technique. The hila are negative. IMPRESSION: Subtle bilateral infiltrate.  No significant change from prior. Electronically Signed   By: Monte Fantasia M.D.   On: 02/16/2019 08:37   Dg Chest Port 1 View  Result Date: 02/15/2019 CLINICAL DATA:  COVID-19 infection. EXAM: PORTABLE CHEST 1 VIEW COMPARISON:  Chest x-ray from yesterday. FINDINGS: The heart size and mediastinal contours are within normal limits. Normal pulmonary vascularity. Minimal increased density at the right lung base. No focal consolidation, pleural effusion, or pneumothorax. No acute osseous abnormality. IMPRESSION: 1. Minimal increased density at the right lung base may reflect atelectasis, although early COVID-19 pneumonia could have a similar appearance. Electronically Signed   By: Titus Dubin M.D.   On: 02/15/2019 08:29   Dg Chest Portable 1 View  Result Date: 02/14/2019 CLINICAL DATA:  Cough, weakness for 2 days EXAM: PORTABLE CHEST 1 VIEW COMPARISON:  None. FINDINGS: The heart size and mediastinal contours are within normal limits. Both lungs are clear. The visualized skeletal structures are unremarkable. IMPRESSION: No active disease. Electronically Signed   By: Kathreen Devoid   On: 02/14/2019 10:50      TODAY-DAY OF DISCHARGE:  Subjective:   Okechukwu Paladino today has no headache,no chest abdominal pain,no new weakness tingling or numbness, feels much better wants to go home today.   Objective:   Blood pressure 117/79, pulse 89, temperature 98.8 F (37.1 C), temperature source Oral, resp. rate 20, height 6\' 1"  (1.854 m), weight (!) 154 kg, SpO2 93 %.  Intake/Output Summary (Last 24 hours) at 02/21/2019 0952 Last data filed at 02/21/2019 0600 Gross per 24 hour  Intake --  Output 1500 ml  Net -1500 ml   Filed Weights   02/14/19 1008 02/14/19 1801  Weight: (!) 154.2 kg (!) 154 kg    Exam: Awake  Alert, Oriented *3, No new F.N deficits, Normal affect [place].AT,PERRAL Supple Neck,No JVD, No cervical lymphadenopathy appriciated.  Symmetrical Chest wall movement, Good air movement bilaterally, CTAB RRR,No Gallops,Rubs or new Murmurs, No Parasternal Heave +ve B.Sounds, Abd Soft, Non tender, No organomegaly appriciated, No rebound -guarding or rigidity. No Cyanosis, Clubbing or edema, No new Rash or bruise   PERTINENT RADIOLOGIC STUDIES: Dg Chest Port 1 View  Result Date: 02/16/2019 CLINICAL  DATA:  COVID-19 EXAM: PORTABLE CHEST 1 VIEW COMPARISON:  Yesterday FINDINGS: Subtle bilateral infiltrate. Normal heart size. Prominent upper mediastinal with likely from portable technique. The hila are negative. IMPRESSION: Subtle bilateral infiltrate.  No significant change from prior. Electronically Signed   By: Marnee Spring M.D.   On: 02/16/2019 08:37   Dg Chest Port 1 View  Result Date: 02/15/2019 CLINICAL DATA:  COVID-19 infection. EXAM: PORTABLE CHEST 1 VIEW COMPARISON:  Chest x-ray from yesterday. FINDINGS: The heart size and mediastinal contours are within normal limits. Normal pulmonary vascularity. Minimal increased density at the right lung base. No focal consolidation, pleural effusion, or pneumothorax. No acute osseous abnormality. IMPRESSION: 1. Minimal increased density at the right lung base may reflect atelectasis, although early COVID-19 pneumonia could have a similar appearance. Electronically Signed   By: Obie Dredge M.D.   On: 02/15/2019 08:29   Dg Chest Portable 1 View  Result Date: 02/14/2019 CLINICAL DATA:  Cough, weakness for 2 days EXAM: PORTABLE CHEST 1 VIEW COMPARISON:  None. FINDINGS: The heart size and mediastinal contours are within normal limits. Both lungs are clear. The visualized skeletal structures are unremarkable. IMPRESSION: No active disease. Electronically Signed   By: Elige Ko   On: 02/14/2019 10:50     PERTINENT LAB RESULTS: CBC: Recent Labs     02/19/19 0515  WBC 10.2  HGB 12.9*  HCT 42.6  PLT 227   CMET CMP     Component Value Date/Time   NA 140 02/19/2019 0515   K 4.8 02/19/2019 0515   CL 103 02/19/2019 0515   CO2 28 02/19/2019 0515   GLUCOSE 294 (H) 02/19/2019 0515   BUN 21 (H) 02/19/2019 0515   CREATININE 0.91 02/19/2019 0515   CALCIUM 9.4 02/19/2019 0515   PROT 7.0 02/19/2019 0515   ALBUMIN 3.3 (L) 02/19/2019 0515   AST 25 02/19/2019 0515   ALT 61 (H) 02/19/2019 0515   ALKPHOS 37 (L) 02/19/2019 0515   BILITOT 0.3 02/19/2019 0515   GFRNONAA >60 02/19/2019 0515   GFRAA >60 02/19/2019 0515    GFR Estimated Creatinine Clearance: 137 mL/min (by C-G formula based on SCr of 0.91 mg/dL). No results for input(s): LIPASE, AMYLASE in the last 72 hours. No results for input(s): CKTOTAL, CKMB, CKMBINDEX, TROPONINI in the last 72 hours. Invalid input(s): POCBNP Recent Labs    02/19/19 0515  DDIMER 0.29   No results for input(s): HGBA1C in the last 72 hours. No results for input(s): CHOL, HDL, LDLCALC, TRIG, CHOLHDL, LDLDIRECT in the last 72 hours. No results for input(s): TSH, T4TOTAL, T3FREE, THYROIDAB in the last 72 hours.  Invalid input(s): FREET3 Recent Labs    02/19/19 0515  FERRITIN 370*   Coags: No results for input(s): INR in the last 72 hours.  Invalid input(s): PT Microbiology: Recent Results (from the past 240 hour(s))  Novel Coronavirus,NAA,(SEND-OUT TO REF LAB - TAT 24-48 hrs); Hosp Order     Status: Abnormal   Collection Time: 02/14/19 10:37 AM   Specimen: Nasopharyngeal Swab; Respiratory  Result Value Ref Range Status   SARS-CoV-2, NAA DETECTED (A) NOT DETECTED Final    Comment: (NOTE) Testing was performed using the cobas(R) SARS-CoV-2 test. This test was developed and its performance characteristics determined by [employer]. This test has not been FDA cleared or approved. This test has been authorized by FDA under an Emergency Use Authorization (EUA). This test is only  authorized for the duration of time the declaration that circumstances exist justifying the  authorization of the emergency use of in vitro diagnostic tests for detection of SARS-CoV-2 virus and/or diagnosis of COVID-19 infection under section 564(b)(1) of the Act, 21 U.S.C. 960AVW-0(J)(8360bbb-3(b)(1), unless the authorization is terminated or revoked sooner. When diagnostic testing is negative, the possibility of a false negative result should be considered in the context of a patient's recent exposures and the presence of clinical signs and symptoms consistent with COVID-19. An individual without symptoms of COVID-19 and who is not shedding SARS-CoV-2 virus would expect to have  a negative (not detected) result in this assay. Performed At: [facility] [address] [place], [place] [id number] Jolene SchimkeNagendra Sanjai MD FA:[id number]:[phone]    Coronavirus Source NASOPHARYNGEAL  Final    Comment: Performed at [facility] [place], [address] [place], [place] [address]  Blood culture (routine x 2)     Status: None   Collection Time: 02/14/19  1:23 PM   Specimen: BLOOD  Result Value Ref Range Status   Specimen Description   Final    BLOOD RIGHT ANTECUBITAL Performed at [facility] [place], [address] [place], [place] [address]    Special Requests   Final    BOTTLES DRAWN AEROBIC AND ANAEROBIC Blood Culture adequate volume Performed at [facility] [place], [address] [place], [place] [address]    Culture   Final    NO GROWTH 5 DAYS Performed at [facility], [address] [place], [place] [address]    Report Status 02/19/2019 FINAL  Final  SARS Coronavirus 2 (Hosp order,Performed in [facility] lab via [employer] ID)     Status: Abnormal   Collection Time: 02/14/19  1:53 PM   Specimen: Dry Nasal Swab ([employer] Now)  Result Value Ref Range Status   SARS Coronavirus 2 ([employer] Now) POSITIVE (A) NEGATIVE Final    Comment: RESULT CALLED TO, READ BACK BY AND VERIFIED  WITH: AMY HARTLEY RN @1416  02/14/2019 OLSONM (NOTE) Interpretive Result Comment(s): COVID 19 Positive SARS CoV 2 target nucleic acids are DETECTED. The SARS CoV 2 RNA is generally detectable in upper and lower respiratory specimens during the acute phase of infection.  Positive results are indicative of active infection with SARS CoV 2.  Clinical correlation with patient history and other diagnostic information is necessary to determine patient infection status.  Positive results do not rule out bacterial infection or coinfection with other viruses. The expected result is Negative. COVID 19 Negative SARS CoV 2 target nucleic acids are NOT DETECTED. The SARS CoV 2 RNA is generally detectable in upper and lower respiratory specimens during the acute phase of infection.  Negative results do not preclude SARS CoV 2 infection, do not rule out coinfections with other pathogens, and should not be used as the sole basis for treatme nt or other patient management decisions.  Negative results must be combined with clinical observations, patient history, and epidemiological information. The expected result is Negative. Invalid Presence or absence of SARS CoV 2 nucleic acids cannot be determined. Repeat testing was performed on the submitted specimen and repeated Invalid results were obtained.  If clinically indicated, additional testing on a new specimen with an alternate test methodology [phone]([id number]) is advised.  The SARS CoV 2 RNA is generally detectable in upper and lower respiratory specimens during the acute phase of infection. The expected result is Negative. Fact Sheet for Patients:  http://www.graves-ford.org/https://www.fda.gov/media/136524/download Fact Sheet for Healthcare Providers: EnviroConcern.sihttps://www.fda.gov/media/136523/download This test is not yet approved or cleared by  the [employer] and has been authorized for detection and/or diagnosis of SARS CoV 2 by FDA under an Emergency Use Authorization (EUA).   This EU A will remain in effect (meaning this test can be used) for the duration of the COVID19 declaration under Section 564(b)(1) of the Act, 21 U.S.C. section 762 207 7222 3(b)(1), unless the authorization is terminated or revoked sooner. Performed at [facility], [address] [place], [place] [address]   Blood culture (routine x 2)     Status: None   Collection Time: 02/14/19  2:00 PM   Specimen: BLOOD  Result Value Ref Range Status   Specimen Description   Final    BLOOD BLOOD RIGHT FOREARM Performed at [facility], [address] [place], [place] [address]    Special Requests   Final    BOTTLES DRAWN AEROBIC AND ANAEROBIC Blood Culture adequate volume Performed at [facility], [address] [place], [place] [address]    Culture   Final    NO GROWTH 5 DAYS Performed at [facility], [address], [place], [place] [address]    Report Status 02/19/2019 FINAL  Final    FURTHER DISCHARGE INSTRUCTIONS:  Get Medicines reviewed and adjusted: Please take all your medications with you for your next visit with your Primary MD  Laboratory/radiological data: Please request your Primary MD to go over all hospital tests and procedure/radiological results at the follow up, please ask your Primary MD to get all Hospital records sent to his/her office.  In some cases, they will be blood work, cultures and biopsy results pending at the time of your discharge. Please request that your primary care M.D. goes through all the records of your hospital data and follows up on these results.  Also Note the following: If you experience worsening of your admission symptoms, develop shortness of breath, life threatening emergency, suicidal or homicidal thoughts you must seek medical attention immediately by calling 911 or calling your MD immediately  if symptoms less severe.  You must read complete instructions/literature along with all the possible  adverse reactions/side effects for all the Medicines you take and that have been prescribed to you. Take any new Medicines after you have completely understood and accpet all the possible adverse reactions/side effects.   Do not drive when taking Pain medications or sleeping medications (Benzodaizepines)  Do not take more than prescribed Pain, Sleep and Anxiety Medications. It is not advisable to combine anxiety,sleep and pain medications without talking with your primary care practitioner  Special Instructions: If you have smoked or chewed Tobacco  in the last 2 yrs please stop smoking, stop any regular Alcohol  and or any Recreational drug use.  Wear Seat belts while driving.  Please note: You were cared for by a hospitalist during your hospital stay. Once you are discharged, your primary care physician will handle any further medical issues. Please note that NO REFILLS for any discharge medications will be authorized once you are discharged, as it is imperative that you return to your primary care physician (or establish a relationship with a primary care physician if you do not have one) for your post hospital discharge needs so that they can reassess your need for medications and monitor your lab values.  Total Time spent coordinating discharge including counseling, education and face to face time equals 35 minutes.  SignedJeoffrey Massed 02/21/2019 9:52 AM

## 2019-02-21 NOTE — Plan of Care (Signed)
  Problem: Education: Goal: Knowledge of General Education information will improve Description: Including pain rating scale, medication(s)/side effects and non-pharmacologic comfort measures 02/21/2019 1236 by Shanon Ace, RN Outcome: Completed/Met 02/21/2019 0735 by Shanon Ace, RN Outcome: Progressing   Problem: Health Behavior/Discharge Planning: Goal: Ability to manage health-related needs will improve 02/21/2019 1236 by Shanon Ace, RN Outcome: Completed/Met 02/21/2019 0735 by Shanon Ace, RN Outcome: Progressing   Problem: Clinical Measurements: Goal: Ability to maintain clinical measurements within normal limits will improve 02/21/2019 1236 by Shanon Ace, RN Outcome: Completed/Met 02/21/2019 0735 by Shanon Ace, RN Outcome: Progressing Goal: Will remain free from infection 02/21/2019 1236 by Shanon Ace, RN Outcome: Completed/Met 02/21/2019 0735 by Shanon Ace, RN Outcome: Progressing Goal: Diagnostic test results will improve 02/21/2019 1236 by Shanon Ace, RN Outcome: Completed/Met 02/21/2019 0735 by Shanon Ace, RN Outcome: Progressing Goal: Respiratory complications will improve 02/21/2019 1236 by Shanon Ace, RN Outcome: Completed/Met 02/21/2019 0735 by Shanon Ace, RN Outcome: Progressing Goal: Cardiovascular complication will be avoided 02/21/2019 1236 by Shanon Ace, RN Outcome: Completed/Met 02/21/2019 [place] by Shanon Ace, RN Outcome: Progressing   Problem: Activity: Goal: Risk for activity intolerance will decrease 02/21/2019 1236 by Shanon Ace, RN Outcome: Completed/Met 02/21/2019 0735 by Shanon Ace, RN Outcome: Progressing   Problem: Nutrition: Goal: Adequate nutrition will be maintained 02/21/2019 1236 by Shanon Ace, RN Outcome: Completed/Met 02/21/2019 0735 by Shanon Ace, RN Outcome: Progressing   Problem: Coping: Goal: Level of anxiety will decrease 02/21/2019 1236 by Shanon Ace, RN Outcome:  Completed/Met 02/21/2019 0735 by Shanon Ace, RN Outcome: Progressing   Problem: Elimination: Goal: Will not experience complications related to bowel motility 02/21/2019 1236 by Shanon Ace, RN Outcome: Completed/Met 02/21/2019 0735 by Shanon Ace, RN Outcome: Progressing Goal: Will not experience complications related to urinary retention 02/21/2019 1236 by Shanon Ace, RN Outcome: Completed/Met 02/21/2019 0735 by Shanon Ace, RN Outcome: Progressing   Problem: Pain Managment: Goal: General experience of comfort will improve 02/21/2019 1236 by Shanon Ace, RN Outcome: Completed/Met 02/21/2019 0735 by Shanon Ace, RN Outcome: Progressing   Problem: Safety: Goal: Ability to remain free from injury will improve 02/21/2019 1236 by Shanon Ace, RN Outcome: Completed/Met 02/21/2019 0735 by Shanon Ace, RN Outcome: Progressing   Problem: Skin Integrity: Goal: Risk for impaired skin integrity will decrease 02/21/2019 1236 by Shanon Ace, RN Outcome: Completed/Met 02/21/2019 0735 by Shanon Ace, RN Outcome: Progressing   Problem: Education: Goal: Knowledge of risk factors and measures for prevention of condition will improve 02/21/2019 1236 by Shanon Ace, RN Outcome: Completed/Met 02/21/2019 0735 by Shanon Ace, RN Outcome: Progressing   Problem: Coping: Goal: Psychosocial and spiritual needs will be supported 02/21/2019 1236 by Shanon Ace, RN Outcome: Completed/Met 02/21/2019 0735 by Shanon Ace, RN Outcome: Progressing   Problem: Respiratory: Goal: Will maintain a patent airway 02/21/2019 1236 by Shanon Ace, RN Outcome: Completed/Met 02/21/2019 0735 by Shanon Ace, RN Outcome: Progressing Goal: Complications related to the disease process, condition or treatment will be avoided or minimized 02/21/2019 1236 by Shanon Ace, RN Outcome: Completed/Met 02/21/2019 [place] by Shanon Ace, RN Outcome: Progressing

## 2019-11-16 ENCOUNTER — Ambulatory Visit: Payer: Commercial Managed Care - PPO | Admitting: Allergy

## 2019-11-16 ENCOUNTER — Encounter: Payer: Self-pay | Admitting: Allergy

## 2019-11-16 ENCOUNTER — Other Ambulatory Visit: Payer: Self-pay

## 2019-11-16 VITALS — BP 114/82 | HR 81 | Temp 98.0°F | Resp 18 | Ht 73.0 in | Wt 333.2 lb

## 2019-11-16 DIAGNOSIS — L508 Other urticaria: Secondary | ICD-10-CM | POA: Diagnosis not present

## 2019-11-16 MED ORDER — MONTELUKAST SODIUM 10 MG PO TABS: 10.0000 mg | ORAL_TABLET | Freq: Every day | ORAL | 2 refills | Status: DC

## 2019-11-16 NOTE — Patient Instructions (Signed)
Chronic Hives  -  at this time etiology of hives and swelling is unknown.  However it does appear you have a component of pressure induced hives.  Hives can be caused by a variety of different triggers including illness/infection, foods, medications, stings, exercise, pressure, vibrations, extremes of temperature to name a few however majority of the time there is no identifiable trigger.  Your symptoms have been ongoing for >6 weeks making this chronic thus will obtain labwork to evaluate: CBC w diff,  tryptase, hive panel, environmental panel  - you have had unremarkable CMP panel (this looks at your liver and kidney function) as well as normal thyroid study done by your PCP recently  - for management of hives recommend the following high-dose antihistamine regimen: Allegra 180mg  1 tab twice a day with Pepcid 20mg  1 tab twice a day as well as Singulair 10mg  1 tab at bedtime.    If you notice any change in mood/behavior/sleep after starting Singulair then stop this medication and let know.  Symptoms resolve after stopping the medication.    - if you still have hive episodes on the high-dose antihistamine regimen above then would recommend starting Xolair monthly injection which we briefly discussed today.  Provided with a brochure today  Follow-up in 2-3 months or sooner if needed

## 2019-11-16 NOTE — Progress Notes (Signed)
New Patient Note  RE: Marcus Fritz MRN: [id number] DOB: 1961/08/20 Date of Office Visit: 11/16/2019  Referring provider: Burna Cash* Primary care provider: Jenel Lucks, PA-C  Chief Complaint: Hives  History of present illness: Marcus Fritz is a 59 y.o. male presenting today for consultation for hives.  He has been having episdoes of hives for the past decade.  He states he last had an episode of hives about 2 to 3 weeks ago.  He did have Covid around [employer] Day last year and states he developed an outbreak of hives about 2 to 3 months after that that persisted up until 2 to 3 weeks ago when it stopped.  He states he was having pretty much daily hives that would come on mostly in the evenings and would interrupt sleep. He states most of the time he can tell when they are about to start as his skin starts to tingle and feel a little bit tight and at that point he states he would typically take a prednisone which would help to control the symptoms.  However if he waited too late in the hives developed then it would take more time for the prednisone to kick in and help the symptoms.  He states he would normally take between 10 to 15 mg of prednisone a day. He does feel like the hives are getting worse as far as he is now having larger areas of hives at the time.  The hives can occur anywhere on the body.  Hives are itchy.  They do not leave behind any bruising.  No associated swelling.  Denies any fevers.  He does report though that for his job if he has to perform tasks where he has to go up high and work harness that the pressure can induce hives and he also has noted that if he has to sit on like a bucket out on the job site that he will later have hives where the edge of the bucket apply pressure to his bottom. He states he has tried using other medications like Zyrtec and other over-the-counter antihistamines, Benadryl, over-the-counter antiitch creams and none  of these things have been helpful.  He states he did have an evaluation for the hives about 10 years ago by an allergist in [facility] and states he did have testing then but he feels that he was told he was negative to foods and environmental allergens.   Review of systems: Review of Systems  Constitutional: Negative.   HENT: Negative.   Eyes: Negative.   Respiratory: Negative.   Cardiovascular: Negative.   Gastrointestinal: Negative.   Musculoskeletal: Negative.   Skin: Positive for itching and rash.  Neurological: Negative.     All other systems negative unless noted above in HPI  Past medical history: Past Medical History:  Diagnosis Date  . Diabetes mellitus without complication ([place])   . Hyperlipidemia   . Hypertension   . Urticaria     Past surgical history: History reviewed. No pertinent surgical history.  Family history:  Family History  Problem Relation Age of Onset  . Allergic rhinitis Sister     Social history: He lives in a house with carpeting in the bedroom with gas heating and central cooling.  No pets in the home no concern for water damage, mildew or roaches in the home.  He is a Government social research officer.  He denies a smoking history.  Medication List: Current Outpatient Medications  Medication Sig Dispense Refill  .  albuterol (VENTOLIN HFA) 108 (90 Base) MCG/ACT inhaler Inhale 1-2 puffs into the lungs every 6 (six) hours as needed for wheezing or shortness of breath. 6.7 g 0  . amLODipine (NORVASC) 10 MG tablet Take 10 mg by mouth daily.    Marland Kitchen atorvastatin (LIPITOR) 40 MG tablet Take 40 mg by mouth daily.    Marland Kitchen EPINEPHrine 0.3 mg/0.3 mL IJ SOAJ injection Inject 1 application into the muscle once.    Marland Kitchen losartan (COZAAR) 50 MG tablet Take 50 mg by mouth daily.     Marland Kitchen omeprazole (PRILOSEC) 20 MG capsule Take 20 mg by mouth daily.    . TRADJENTA 5 MG TABS tablet Take 5 mg by mouth daily.     No current facility-administered medications for this visit.     Known medication allergies: No Active Allergies   Physical examination: Blood pressure 114/82, pulse 81, temperature 98 F (36.7 C), temperature source Temporal, resp. rate 18, height 6\' 1"  (1.854 m), weight (!) 333 lb 3.2 oz (151.1 kg), SpO2 96 %.  General: Alert, interactive, in no acute distress. HEENT: PERRLA, TMs pearly gray, turbinates non-edematous without discharge, post-pharynx non erythematous. Neck: Supple without lymphadenopathy. Lungs: Clear to auscultation without wheezing, rhonchi or rales. {no increased work of breathing. CV: Normal S1, S2 without murmurs. Abdomen: Nondistended, nontender. Skin: Warm and dry, without lesions or rashes. Extremities:  No clubbing, cyanosis or edema. Neuro:   Grossly intact.  Diagnositics/Labs: Labs: Reviewed from PCP from 10/25/2019- Component Value Ref Range  TSH 0.636 0.450 - 5.330 UIU/ML   Comprehensive Metabolic Panel (10/25/2019 8:56 AM EST) Component Value Ref Range  Sodium 137 135 - 146 MMOL/L  Potassium 4.6 3.5 - 5.3 MMOL/L  Chloride 103 98 - 110 MMOL/L  CO2 27 23 - 30 MMOL/L  BUN 14 8 - 24 MG/DL  Glucose 12/25/2019 (H)Comment: Patients taking eltrombopag at doses >/= 100 mg daily may show falsely elevated values of 10% or greater. 70 - 99 MG/DL  Creatinine 378 5.88 - 1.50 MG/DL  Calcium 9.5 8.5 - 5.02 MG/DL  Total Protein 77.4: Patients taking eltrombopag at doses >/= 100 mg daily may show falsely elevated values of 10% or greater. 6.0 - 8.3 G/DL  Albumin  4.4 3.5 - 5.0 G/DL  Total Bilirubin 1.2INOMVEH: Patients taking eltrombopag at doses >/= 100 mg daily may show falsely elevated values of 10% or greater. 0.1 - 1.2 MG/DL  Alkaline Phosphatase 47 25 - 125 IU/L or U/L  AST (SGOT) 15 5 - 40 IU/L or U/L  ALT (SGPT) 21 5 - 50 IU/L or U/L  Anion Gap 7 4 - 14 MMOL/L     Assessment and plan:   Chronic urticaria  -  at this time etiology of hives and swelling is unknown.  However it does appear you have a component of  pressure induced hives.  Hives can be caused by a variety of different triggers including illness/infection, foods, medications, stings, exercise, pressure, vibrations, extremes of temperature to name a few however majority of the time there is no identifiable trigger.  Your symptoms have been ongoing for >6 weeks making this chronic thus will obtain labwork to evaluate: CBC w diff,  tryptase, hive panel, environmental panel  - you have had unremarkable CMP panel (this looks at your liver and kidney function) as well as normal thyroid study done by your PCP recently  - for management of hives recommend the following high-dose antihistamine regimen: Allegra 180mg  1 tab twice a day with Pepcid 20mg   1 tab twice a day as well as Singulair 10mg  1 tab at bedtime.    If you notice any change in mood/behavior/sleep after starting Singulair then stop this medication and let know.  Symptoms resolve after stopping the medication.    - if you still have hive episodes on the high-dose antihistamine regimen above then would recommend starting Xolair monthly injection which we briefly discussed today.  Provided with a brochure today  Follow-up in 2-3 months or sooner if needed  I appreciate the opportunity to take part in Marcus Fritz's care. Please do not hesitate to contact me with questions.  Sincerely,   [place], MD Allergy/Immunology [facility]

## 2019-11-23 LAB — ALLERGENS W/TOTAL IGE AREA 2
Alternaria Alternata IgE: <0.1 kU/L
Aspergillus Fumigatus IgE: <0.1 kU/L
Bermuda Grass IgE: <0.1 kU/L
Cat Dander IgE: <0.1 kU/L
Cedar, Mountain IgE: <0.1 kU/L
Cladosporium Herbarum IgE: <0.1 kU/L
Cockroach, German IgE: 0.16 kU/L — AB
Common Silver Birch IgE: <0.1 kU/L
Cottonwood IgE: <0.1 kU/L
D Farinae IgE: <0.1 kU/L
D Pteronyssinus IgE: <0.1 kU/L
Dog Dander IgE: <0.1 kU/L
Elm, American IgE: <0.1 kU/L
IgE (Immunoglobulin E), Serum: 20 IU/mL (ref 6–495)
Johnson Grass IgE: <0.1 kU/L
Maple/Box Elder IgE: <0.1 kU/L
Mouse Urine IgE: <0.1 kU/L
Oak, White IgE: <0.1 kU/L
Pecan, Hickory IgE: <0.1 kU/L
Penicillium Chrysogen IgE: <0.1 kU/L
Pigweed, Rough IgE: <0.1 kU/L
Ragweed, Short IgE: <0.1 kU/L
Sheep Sorrel IgE Qn: <0.1 kU/L
Timothy Grass IgE: <0.1 kU/L
White Mulberry IgE: <0.1 kU/L

## 2019-11-23 LAB — CBC WITH DIFFERENTIAL
Basophils Absolute: 0 10*3/uL (ref 0.0–0.2)
Basos: 1 %
EOS (ABSOLUTE): 0.1 10*3/uL (ref 0.0–0.4)
Eos: 3 %
Hematocrit: 40.2 % (ref 37.5–51.0)
Hemoglobin: 13.3 g/dL (ref 13.0–17.7)
Immature Grans (Abs): 0 10*3/uL (ref 0.0–0.1)
Immature Granulocytes: 0 %
Lymphocytes Absolute: 1.6 10*3/uL (ref 0.7–3.1)
Lymphs: 40 %
MCH: 26.9 pg (ref 26.6–33.0)
MCHC: 33.1 g/dL (ref 31.5–35.7)
MCV: 81 fL (ref 79–97)
Monocytes Absolute: 0.5 10*3/uL (ref 0.1–0.9)
Monocytes: 11 %
Neutrophils Absolute: 1.8 10*3/uL (ref 1.4–7.0)
Neutrophils: 45 %
RBC: 4.95 x10E6/uL (ref 4.14–5.80)
RDW: 14.3 % (ref 11.6–15.4)
WBC: 4 10*3/uL (ref 3.4–10.8)

## 2019-11-23 LAB — TRYPTASE: Tryptase: 6.7 ug/L (ref 2.2–13.2)

## 2019-11-23 LAB — THYROID PEROXIDASE ANTIBODY: Thyroperoxidase Ab SerPl-aCnc: <9 IU/mL (ref 0–34)

## 2019-11-23 LAB — CHRONIC URTICARIA: cu index: 21.5 — ABNORMAL HIGH (ref <10)

## 2020-02-16 ENCOUNTER — Other Ambulatory Visit: Payer: Self-pay

## 2020-02-16 ENCOUNTER — Encounter: Payer: Self-pay | Admitting: Allergy

## 2020-02-16 ENCOUNTER — Other Ambulatory Visit: Payer: Self-pay | Admitting: Allergy

## 2020-02-16 ENCOUNTER — Ambulatory Visit: Payer: Commercial Managed Care - PPO | Admitting: Allergy

## 2020-02-16 VITALS — BP 142/80 | HR 84 | Resp 18 | Ht 73.0 in

## 2020-02-16 DIAGNOSIS — L508 Other urticaria: Secondary | ICD-10-CM | POA: Diagnosis not present

## 2020-02-16 DIAGNOSIS — J3089 Other allergic rhinitis: Secondary | ICD-10-CM | POA: Diagnosis not present

## 2020-02-16 MED ORDER — AZELASTINE HCL 0.1 % NA SOLN: 2.0000 | Freq: Two times a day (BID) | NASAL | 5 refills | Status: AC | PRN

## 2020-02-16 NOTE — Progress Notes (Signed)
Follow-up Note  RE: Marcus Fritz MRN: [id number] DOB: 1961-01-30 Date of Office Visit: 02/16/2020   History of present illness: Marcus Fritz is a 59 y.o. male presenting today for follow-up of chronic urticaria.  He was last seen in the office on 11/16/2019 by myself.  He states that the hives did come back about a month ago and he did start on the high-dose regimen that was recommended at his last visit.  He has been doing for the most part Allegra twice a day, Pepcid twice a day and Singulair once a day.  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  He states when this happens he does seem to have more issues with the hives.  With the medications on board as prescribed he states the hives are manageable. He does report that he has been having more sinus symptoms.  He states he has had "3 sinus infections" since he had Covid last year around Father's Day.  He states he tries to let the sinus infection run its course and has not required any antibiotic or steroid.  He states he does not really have any significant pressure in his cheeks or forehead but he does have a lots of postnasal drainage that leads to tickle in the throat and cough.  Currently not using anything to help with the symptoms.  Review of systems: Review of Systems  Constitutional: Negative.   HENT:       See HPI  Eyes: Negative.   Respiratory: Positive for cough. Negative for sputum production, shortness of breath and wheezing.   Cardiovascular: Negative.   Gastrointestinal: Negative.   Musculoskeletal: Negative.   Skin: Positive for itching and rash.  Neurological: Negative.     All other systems negative unless noted above in HPI  Past medical/social/surgical/family history have been reviewed and are unchanged unless specifically indicated below.  No changes  Medication List: Current Outpatient Medications    Medication Sig Dispense Refill  . albuterol (VENTOLIN HFA) 108 (90 Base) MCG/ACT inhaler Inhale 1-2 puffs into the lungs every 6 (six) hours as needed for wheezing or shortness of breath. 6.7 g 0  . amLODipine (NORVASC) 10 MG tablet Take 10 mg by mouth daily.    Marland Kitchen aspirin 81 MG EC tablet Take by mouth.    Marland Kitchen atorvastatin (LIPITOR) 40 MG tablet Take 40 mg by mouth daily.    . BD PEN NEEDLE NANO 2ND GEN 32G X 4 MM MISC TO BE USED WITH INSULIN PEN    . EPINEPHrine 0.3 mg/0.3 mL IJ SOAJ injection Inject 1 application into the muscle once.    . famotidine (PEPCID) 20 MG tablet Take 20 mg by mouth 2 (two) times daily.    . fexofenadine (ALLEGRA) 180 MG tablet Take 180 mg by mouth daily.    . hydrochlorothiazide (MICROZIDE) 12.5 MG capsule Take by mouth.    . insulin glargine (LANTUS) 100 UNIT/ML Solostar Pen Inject into the skin.    . Insulin Pen Needle (BD PEN NEEDLE NANO U/F) 32G X 4 MM MISC TO BE USED WITH INSULIN PEN  DX: E11.65    . LORazepam (ATIVAN) 1 MG tablet Take by mouth.    . losartan (COZAAR) 100 MG tablet Take 100 mg by mouth daily.    Letta Pate ULTRA test strip SMARTSIG:Via Meter 1 to 2 Times Daily    .  pantoprazole (PROTONIX) 40 MG tablet Take by mouth.    . tadalafil (CIALIS) 5 MG tablet TAKE 1 TABLET(5 MG) BY MOUTH DAILY AS NEEDED FOR ERECTILE DYSFUNCTION    . TRADJENTA 5 MG TABS tablet Take 5 mg by mouth daily.    Marland Kitchen azelastine (ASTELIN) 0.1 % nasal spray Place 2 sprays into both nostrils 2 (two) times daily as needed for rhinitis. Use in each nostril as directed 30 mL 5  . montelukast (SINGULAIR) 10 MG tablet TAKE 1 TABLET(10 MG) BY MOUTH AT BEDTIME 30 tablet 5   No current facility-administered medications for this visit.     Known medication allergies: Allergies  Allergen Reactions  . Metformin Shortness Of Breath and Swelling    LIPS SWELLING AND COULDN'T BREATHE     Physical examination: Blood pressure (!) 142/80, pulse 84, resp. rate 18, height 6\' 1"  (1.854 m),  SpO2 98 %.  General: Alert, interactive, in no acute distress. HEENT: PERRLA, TMs pearly gray, turbinates minimally edematous with clear discharge, post-pharynx non erythematous. Neck: Supple without lymphadenopathy. Lungs: Clear to auscultation without wheezing, rhonchi or rales. {no increased work of breathing. CV: Normal S1, S2 without murmurs. Abdomen: Nondistended, nontender. Skin: Warm and dry, without lesions or rashes. Extremities:  No clubbing, cyanosis or edema. Neuro:   Grossly intact.  Diagnositics/Labs: Labs:  Component     Latest Ref Rng & Units 11/16/2019  IgE (Immunoglobulin E), Serum     6 - 495 IU/mL 20  D Pteronyssinus IgE     Class 0 kU/L <0.10  D Farinae IgE     Class 0 kU/L <0.10  Cat Dander IgE     Class 0 kU/L <0.10  Dog Dander IgE     Class 0 kU/L <0.10  11/18/2019 Grass IgE     Class 0 kU/L <0.10  Timothy Grass IgE     Class 0 kU/L <0.10  Johnson Grass IgE     Class 0 kU/L <0.10  Cockroach, German IgE     Class 0/I kU/L 0.16 (A)  Penicillium Chrysogen IgE     Class 0 kU/L <0.10  Cladosporium Herbarum IgE     Class 0 kU/L <0.10  Aspergillus Fumigatus IgE     Class 0 kU/L <0.10  Alternaria Alternata IgE     Class 0 kU/L <0.10  Maple/Box Elder IgE     Class 0 kU/L <0.10  Common Silver [place] IgE     Class 0 kU/L <0.10  Cedar, 01-07-1973 IgE     Class 0 kU/L <0.10  Oak, White IgE     Class 0 kU/L <0.10  Elm, American IgE     Class 0 kU/L <0.10  [place] IgE     Class 0 kU/L <0.10  Pecan, Hickory IgE     Class 0 kU/L <0.10  White Mulberry IgE     Class 0 kU/L <0.10  Ragweed, Short IgE     Class 0 kU/L <0.10  Pigweed, Rough IgE     Class 0 kU/L <0.10  Sheep Sorrel IgE Qn     Class 0 kU/L <0.10  Mouse Urine IgE     Class 0 kU/L <0.10  WBC     3.4 - 10.8 x10E3/uL 4.0  RBC     4.14 - 5.80 x10E6/uL 4.95  Hemoglobin     13.0 - 17.7 g/dL 02[birth year]1973  HCT     52.7 - 78.2 % 40.2  MCV     79 - 97 fL 81  MCH  26.6 - 33.0 pg 26.9  MCHC      31 - 35 g/dL 33.1  RDW     11.6 - 15.4 % 14.3  Neutrophils     Not Estab. % 45  Lymphs     Not Estab. % 40  Monocytes     Not Estab. % 11  Eos     Not Estab. % 3  Basos     Not Estab. % 1  NEUT#     1 - 7 x10E3/uL 1.8  Lymphocyte #     0 - 3 x10E3/uL 1.6  Monocytes Absolute     0 - 0 x10E3/uL 0.5  EOS (ABSOLUTE)     0.0 - 0.4 x10E3/uL 0.1  Basophils Absolute     0 - 0 x10E3/uL 0.0  Immature Granulocytes     Not Estab. % 0  Immature Grans (Abs)     0.0 - 0.1 x10E3/uL 0.0  cu index     <10 21.5 (H)  Thyroperoxidase Ab SerPl-aCnc     0 - 34 IU/mL <9  Tryptase     2.2 - 13.2 ug/L 6.7    Assessment and plan:   Chronic Hives  -  at this time etiology of hives and swelling is Autoimmune in nature.  However it does appear you also have a component of pressure induced hives.  Hives can be caused by a variety of different triggers including illness/infection, foods, medications, stings, exercise, pressure, vibrations, extremes of temperature to name a few however majority of the time there is no identifiable trigger.  Your labs showed chronic urticaria index is elevated thus this shows that he makes an antibody that can target his allergy cells to produce hives. Autoimmune hives can be longer lasting and can be more difficult to treat.  Environmental allergy panel shows very low level to cockroach.  CBC, thyroid antibody studies, tryptase are unremarkable  - for management of hives continue the following high-dose antihistamine regimen: Allegra 180mg  1 tab twice a day with Pepcid 20mg  1 tab twice a day as well as Singulair 10mg  1 tab at bedtime.    - once you have been hive free for about 2 weeks you can start to wean the medications as follows:  Allegra 180mg  twice a day and famotidine 20 mg (Pepcid) once a day and Singulair daily.  If no symptoms for 7-14 days then decrease to.  Allegra 180mg  twice a day and Singulair daily.  If no symptoms for 7-14 days then decrease  to.  Allegra 180mg  once a day and Singulair daily. If no symptoms for 7-14 days then decrease to.  Singulair daily. If no symptoms for 7-14 days then decrease to.  Stop Singulair and wean is complete          If symptoms return, then resume the last dose stopped   - if you still have hive episodes on the high-dose antihistamine regimen above then would recommend starting Xolair monthly injection which we briefly discussed.  Increased sinus symptoms/allergic rhinitis -he was provided with avoidance measures for cockroach  - recommend use of nasal saline rinses while having increased symptoms and prior to use of medicated nasal sprays  - for control of nasal drainage use nasal antihistamine, Astelin 2 sprays each nostril twice a day as needed.  This should help to decrease nasal drainage   Follow-up in 3-4 months or sooner if needed I appreciate the opportunity to take part in Marcus Fritz's care. Please do not hesitate to  contact me with questions.  Sincerely,   Prudy Feeler, MD Allergy/Immunology [facility]

## 2020-02-16 NOTE — Patient Instructions (Addendum)
Chronic Hives  -  at this time etiology of hives and swelling is Autoimmune in nature.  However it does appear you also have a component of pressure induced hives.  Hives can be caused by a variety of different triggers including illness/infection, foods, medications, stings, exercise, pressure, vibrations, extremes of temperature to name a few however majority of the time there is no identifiable trigger.  Your labs showed chronic urticaria index is elevated thus this shows that he makes an antibody that can target his allergy cells to produce hives. Autoimmune hives can be longer lasting and can be more difficult to treat.  Environmental allergy panel shows very low level to cockroach.  CBC, thyroid antibody studies, tryptase are unremarkable  - for management of hives continue the following high-dose antihistamine regimen: Allegra 180mg  1 tab twice a day with Pepcid 20mg  1 tab twice a day as well as Singulair 10mg  1 tab at bedtime.    - once you have been hive free for about 2 weeks you can start to wean the medications as follows:  Allegra 180mg  twice a day and famotidine 20 mg (Pepcid) once a day and Singulair daily.  If no symptoms for 7-14 days then decrease to.  Allegra 180mg  twice a day and Singulair daily.  If no symptoms for 7-14 days then decrease to.  Allegra 180mg  once a day and Singulair daily. If no symptoms for 7-14 days then decrease to.  Singulair daily. If no symptoms for 7-14 days then decrease to.  Stop Singulair and wean is complete          If symptoms return, then resume the last dose stopped   - if you still have hive episodes on the high-dose antihistamine regimen above then would recommend starting Xolair monthly injection which we briefly discussed.  Increased sinus symptoms  - recommend use of nasal saline rinses while having increased symptoms and prior to use of medicated nasal sprays  - for control of nasal drainage use nasal antihistamine, Astelin 2 sprays  each nostril twice a day as needed.  This should help to decrease nasal drainage   Follow-up in 3-4 months or sooner if needed

## 2020-04-19 ENCOUNTER — Telehealth: Payer: Self-pay | Admitting: Allergy

## 2020-04-19 ENCOUNTER — Other Ambulatory Visit: Payer: Self-pay | Admitting: *Deleted

## 2020-04-19 MED ORDER — PREDNISONE 20 MG PO TABS: 20.0000 mg | ORAL_TABLET | Freq: Two times a day (BID) | ORAL | 0 refills | Status: AC

## 2020-04-19 NOTE — Telephone Encounter (Signed)
Prescription has been sent to pharmacy. Called patient and advised. Patient verbalized understanding. Unfortunately we only have 1 150mg  sample of Xolair at this time.

## 2020-04-19 NOTE — Telephone Encounter (Signed)
That is fine.   Can send in prednisone 20mg  1 tab twice a day x 5 days.  Do we have any xolair samples?

## 2020-04-19 NOTE — Telephone Encounter (Signed)
Patient called asking for a sooner appointment then 9/30 due to hives all over his body which started last Saturday. Patient states that his regiment is no longer working. Patient mentioned that he had talked about getting a shot every 30 days, which he thinks he needs now.   Please advise.

## 2020-04-19 NOTE — Telephone Encounter (Signed)
Called patient and he has been placed on the schedule to see you tomorrow in the [place] office. He states that he is completely miserable and he is unable to sleep. He states that he is taking all of his medication as prescribed and it's not helping. He wanted to know if you would send in some prednisone for him to start today until his appointment for tomorrow. I told the patient that I could not guarantee that you would but that I would send you a message. Please advise.

## 2020-04-20 ENCOUNTER — Encounter: Payer: Self-pay | Admitting: Allergy

## 2020-04-20 ENCOUNTER — Ambulatory Visit: Payer: Commercial Managed Care - PPO | Admitting: Allergy

## 2020-04-20 ENCOUNTER — Other Ambulatory Visit: Payer: Self-pay

## 2020-04-20 VITALS — BP 140/72 | HR 79 | Resp 18

## 2020-04-20 DIAGNOSIS — J3089 Other allergic rhinitis: Secondary | ICD-10-CM | POA: Diagnosis not present

## 2020-04-20 DIAGNOSIS — L508 Other urticaria: Secondary | ICD-10-CM

## 2020-04-20 NOTE — Patient Instructions (Signed)
Chronic Hives  -  at this time etiology of hives and swelling is Autoimmune in nature.  However it does appear you also have a component of pressure induced hives.  Hives can be caused by a variety of different triggers including illness/infection, foods, medications, stings, exercise, pressure, vibrations, extremes of temperature to name a few however majority of the time there is no identifiable trigger.  Your labs showed chronic urticaria index is elevated thus this shows that he makes an antibody that can target his allergy cells to produce hives. Autoimmune hives can be longer lasting and can be more difficult to treat.  Environmental allergy panel shows very low level to cockroach.  CBC, thyroid antibody studies, tryptase are unremarkable  - for management of hives continue the following high-dose antihistamine regimen: Allegra 180mg  1 tab twice a day (can take up to 4 tabs of Allegra a day if needed) with Pepcid 20mg  1 tab twice a day as well as Singulair 10mg  1 tab at bedtime.    - complete Prednisone course prescribed for 5 days - will start approval process for Xolair monthly injection for hive control.  Benefits, risks and protocol discussed today.  Informational brochure provided.  Tammy, our nurse coordinator, will contact you regarding approval.   Allergic rhinitis  - continue avoidance measures for cockroach  - recommend use of nasal saline rinse to help keep sinuses clean/flushed out and use prior to use of medicated nasal sprays  - for control of nasal drainage use nasal antihistamine, Astelin 2 sprays each nostril twice a day as needed.  This should help to decrease nasal drainage   Follow-up in 3-4 months or sooner if needed

## 2020-04-20 NOTE — Progress Notes (Signed)
Follow-up Note  RE: Regis Hinton MRN: [id number] DOB: [birth year] Date of Office Visit: 04/20/2020   History of present illness: Avin Rutten is a 59 y.o. male presenting today for follow-up of chronic urticaria.  He was last seen in the office on 02/16/2019 myself.  He states about 2-1/2 weeks ago he had a big stressor arise related to his son.  This has caused his hives to flareup at this time.  He has been having daily hives basically at this time.  They are have gotten to the point that he has been unable to sleep.  He provided pictures of the hives and they are very large welts that were on his back in the picture.  He continues to take Allegra twice a day, Pepcid twice a day and singular daily.  He did call into the office yesterday with his complaints of the hives being so severe that he cannot sleep and has been impacting his day-to-day activities.  I did provide him with a 5-day prednisone course of 20 mg twice a day.  He took a dose last night and today states he has not had any hives occur yet.  He is interested in starting on Xolair at this time.  Review of systems: Review of Systems  Constitutional: Negative.   HENT: Negative.   Eyes: Negative.   Respiratory: Negative.   Cardiovascular: Negative.   Gastrointestinal: Negative.   Musculoskeletal: Negative.   Skin: Positive for itching and rash.  Neurological: Negative.     All other systems negative unless noted above in HPI  Past medical/social/surgical/family history have been reviewed and are unchanged unless specifically indicated below.  No changes  Medication List: Current Outpatient Medications  Medication Sig Dispense Refill  . albuterol (VENTOLIN HFA) 108 (90 Base) MCG/ACT inhaler Inhale 1-2 puffs into the lungs every 6 (six) hours as needed for wheezing or shortness of breath. 6.7 g 0  . amLODipine (NORVASC) 10 MG tablet Take 10 mg by mouth daily.    Marland Kitchen aspirin 81 MG EC tablet Take by mouth.    Marland Kitchen  atorvastatin (LIPITOR) 40 MG tablet Take 40 mg by mouth daily.    Marland Kitchen azelastine (ASTELIN) 0.1 % nasal spray Place 2 sprays into both nostrils 2 (two) times daily as needed for rhinitis. Use in each nostril as directed 30 mL 5  . BD PEN NEEDLE NANO 2ND GEN 32G X 4 MM MISC TO BE USED WITH INSULIN PEN    . EPINEPHrine 0.3 mg/0.3 mL IJ SOAJ injection Inject 1 application into the muscle once.    . famotidine (PEPCID) 20 MG tablet Take 20 mg by mouth 2 (two) times daily.    . fexofenadine (ALLEGRA) 180 MG tablet Take 180 mg by mouth daily.    . hydrochlorothiazide (MICROZIDE) 12.5 MG capsule Take by mouth.    . insulin glargine (LANTUS) 100 UNIT/ML Solostar Pen Inject into the skin.    . Insulin Pen Needle (BD PEN NEEDLE NANO U/F) 32G X 4 MM MISC TO BE USED WITH INSULIN PEN  DX: E11.65    . LORazepam (ATIVAN) 1 MG tablet Take by mouth.    . losartan (COZAAR) 100 MG tablet Take 100 mg by mouth daily.    . montelukast (SINGULAIR) 10 MG tablet TAKE 1 TABLET(10 MG) BY MOUTH AT BEDTIME 30 tablet 5  . ONETOUCH ULTRA test strip SMARTSIG:Via Meter 1 to 2 Times Daily    . pantoprazole (PROTONIX) 40 MG tablet Take by mouth.    Marland Kitchen  predniSONE (DELTASONE) 20 MG tablet Take 1 tablet (20 mg total) by mouth 2 (two) times daily with a meal for 5 days. 10 tablet 0  . tadalafil (CIALIS) 5 MG tablet TAKE 1 TABLET(5 MG) BY MOUTH DAILY AS NEEDED FOR ERECTILE DYSFUNCTION    . TRADJENTA 5 MG TABS tablet Take 5 mg by mouth daily.     No current facility-administered medications for this visit.     Known medication allergies: Allergies  Allergen Reactions  . Metformin Shortness Of Breath and Swelling    LIPS SWELLING AND COULDN'T BREATHE     Physical examination: Blood pressure 140/72, pulse 79, resp. rate 18, SpO2 97 %.  General: Alert, interactive, in no acute distress. HEENT: PERRLA, TMs pearly gray, turbinates non-edematous without discharge, post-pharynx non erythematous. Neck: Supple without  lymphadenopathy. Lungs: Clear to auscultation without wheezing, rhonchi or rales. {no increased work of breathing. CV: Normal S1, S2 without murmurs. Abdomen: Nondistended, nontender. Skin: Warm and dry, without lesions or rashes. Extremities:  No clubbing, cyanosis or edema. Neuro:   Grossly intact.  Diagnositics/Labs: None today  Assessment and plan: Chronic urticaria  -  at this time etiology of hives and swelling is Autoimmune in nature.  However it does appear you also have a component of pressure induced hives.  Hives can be caused by a variety of different triggers including illness/infection, foods, medications, stings, exercise, pressure, vibrations, extremes of temperature to name a few however majority of the time there is no identifiable trigger.  Your labs showed chronic urticaria index is elevated thus this shows that he makes an antibody that can target his allergy cells to produce hives. Autoimmune hives can be longer lasting and can be more difficult to treat.  Environmental allergy panel shows very low level to cockroach.  CBC, thyroid antibody studies, tryptase are unremarkable  - for management of hives continue the following high-dose antihistamine regimen: Allegra 180mg  1 tab twice a day (can take up to 4 tabs of Allegra a day if needed) with Pepcid 20mg  1 tab twice a day as well as Singulair 10mg  1 tab at bedtime.    - complete Prednisone course prescribed for 5 days - will start approval process for Xolair monthly injection for hive control.  Benefits, risks and protocol discussed today.  Informational brochure provided.  Tammy, our nurse coordinator, will contact you regarding approval.    Allergic rhinitis  - continue avoidance measures for cockroach  - recommend use of nasal saline rinse to help keep sinuses clean/flushed out and use prior to use of medicated nasal sprays  - for control of nasal drainage use nasal antihistamine, Astelin 2 sprays each nostril twice a  day as needed.  This should help to decrease nasal drainage   Follow-up in 3-4 months or sooner if needed I appreciate the opportunity to take part in Carsin's care. Please do not hesitate to contact me with questions.  Sincerely,   , MD Allergy/Immunology [facility]

## 2020-04-25 ENCOUNTER — Telehealth: Payer: Self-pay | Admitting: *Deleted

## 2020-04-25 NOTE — Telephone Encounter (Signed)
-----   Message from Tawny Hopping, [place] sent at 04/20/2020 11:38 AM EDT ----- Regarding: Xolair new start Dr. Delorse Lek would like this patient started on xolair for urticaria. He signed consent but did not receive sample today in office. Thank you!

## 2020-04-25 NOTE — Telephone Encounter (Signed)
L/m for patient to contact me in regards to Xolair.  His plan has excluded Xolair from plan so we will need to try and get thru [employer]

## 2020-05-01 NOTE — Telephone Encounter (Signed)
Spoke to patient and advised that his plan excludes Xolair and can try and get Rx through the [facility] patient foundation. Will mail paperwork

## 2020-05-24 ENCOUNTER — Ambulatory Visit: Payer: Commercial Managed Care - PPO | Admitting: Allergy

## 2020-07-16 IMAGING — DX PORTABLE CHEST - 1 VIEW
1 series · 1 of 1 positions shown · non-contrast
Comparison: None.

CLINICAL DATA: Cough, weakness for 2 days

EXAM:
PORTABLE CHEST 1 VIEW

[chest ap]
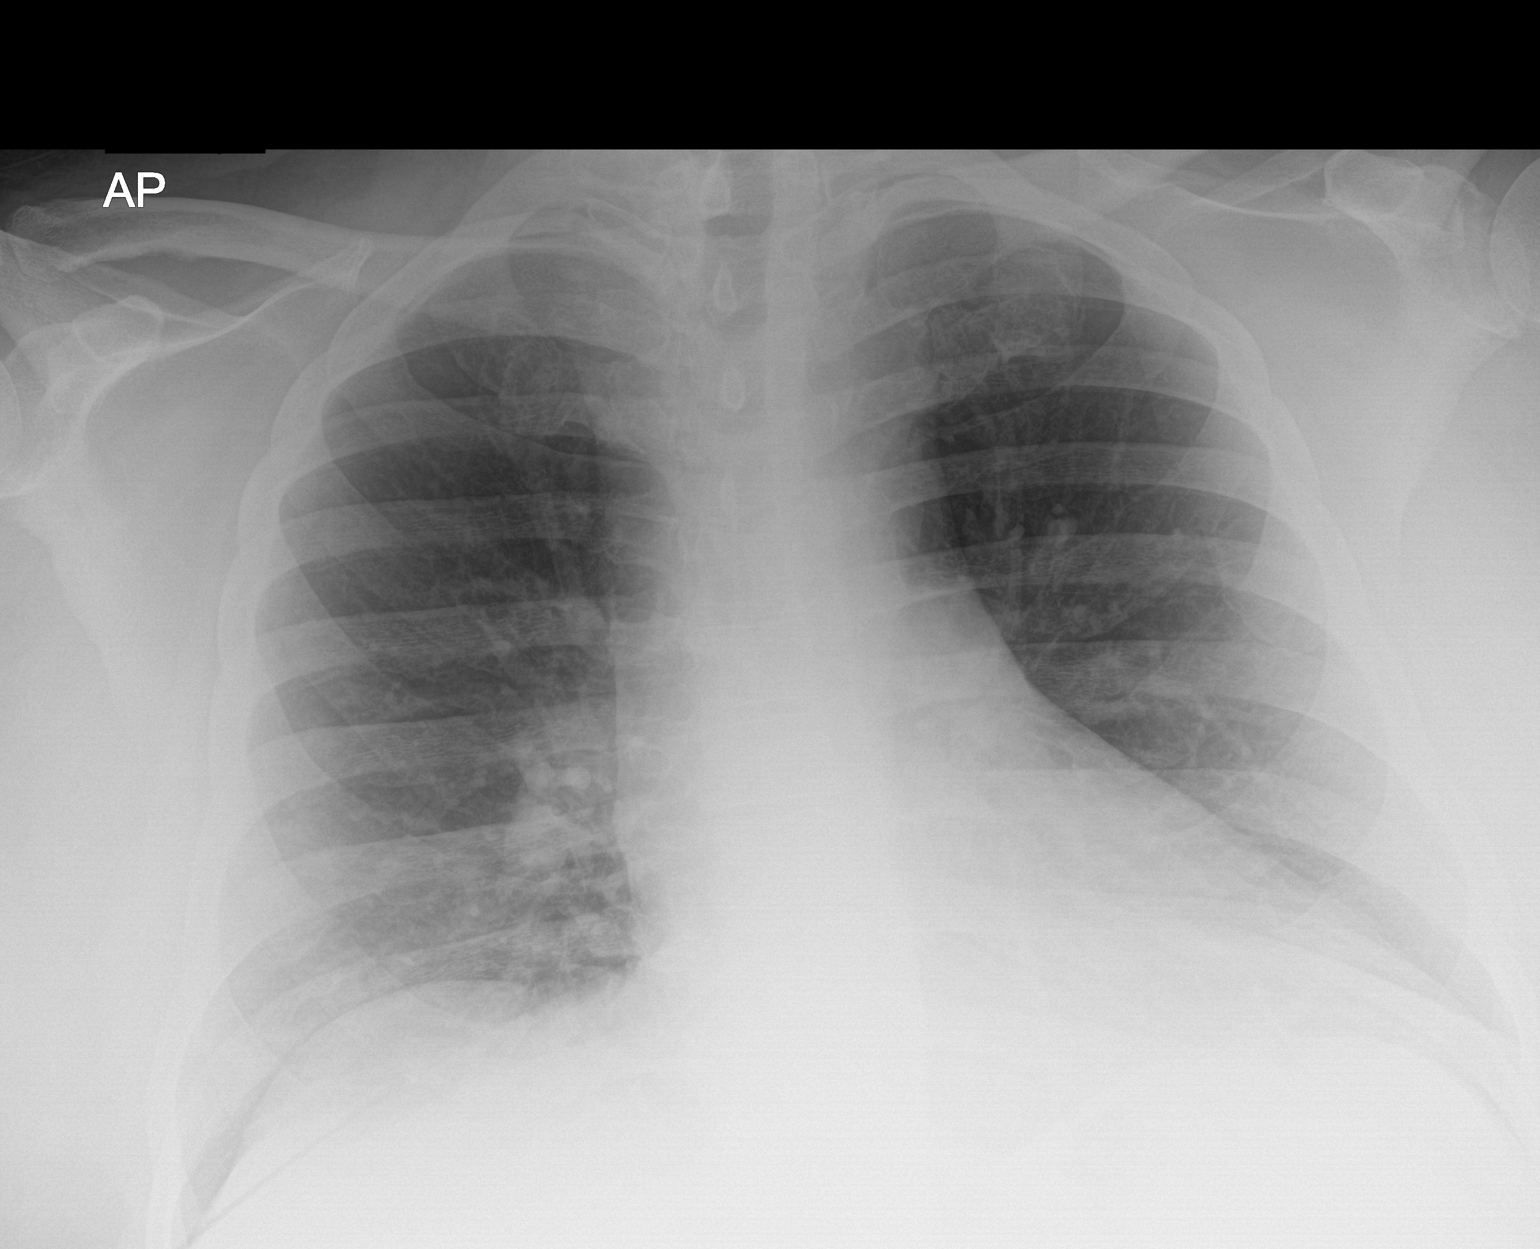

[1 of 1 positions shown; findings below may reference images not displayed]

FINDINGS: The heart size and mediastinal contours are within normal limits.
Both lungs are clear. The visualized skeletal structures are
unremarkable.
IMPRESSION: No active disease.

## 2020-07-18 IMAGING — DX PORTABLE CHEST - 1 VIEW
1 series · 1 of 1 positions shown · non-contrast
Comparison: Yesterday

CLINICAL DATA: [id number]

EXAM:
PORTABLE CHEST 1 VIEW

[chest]
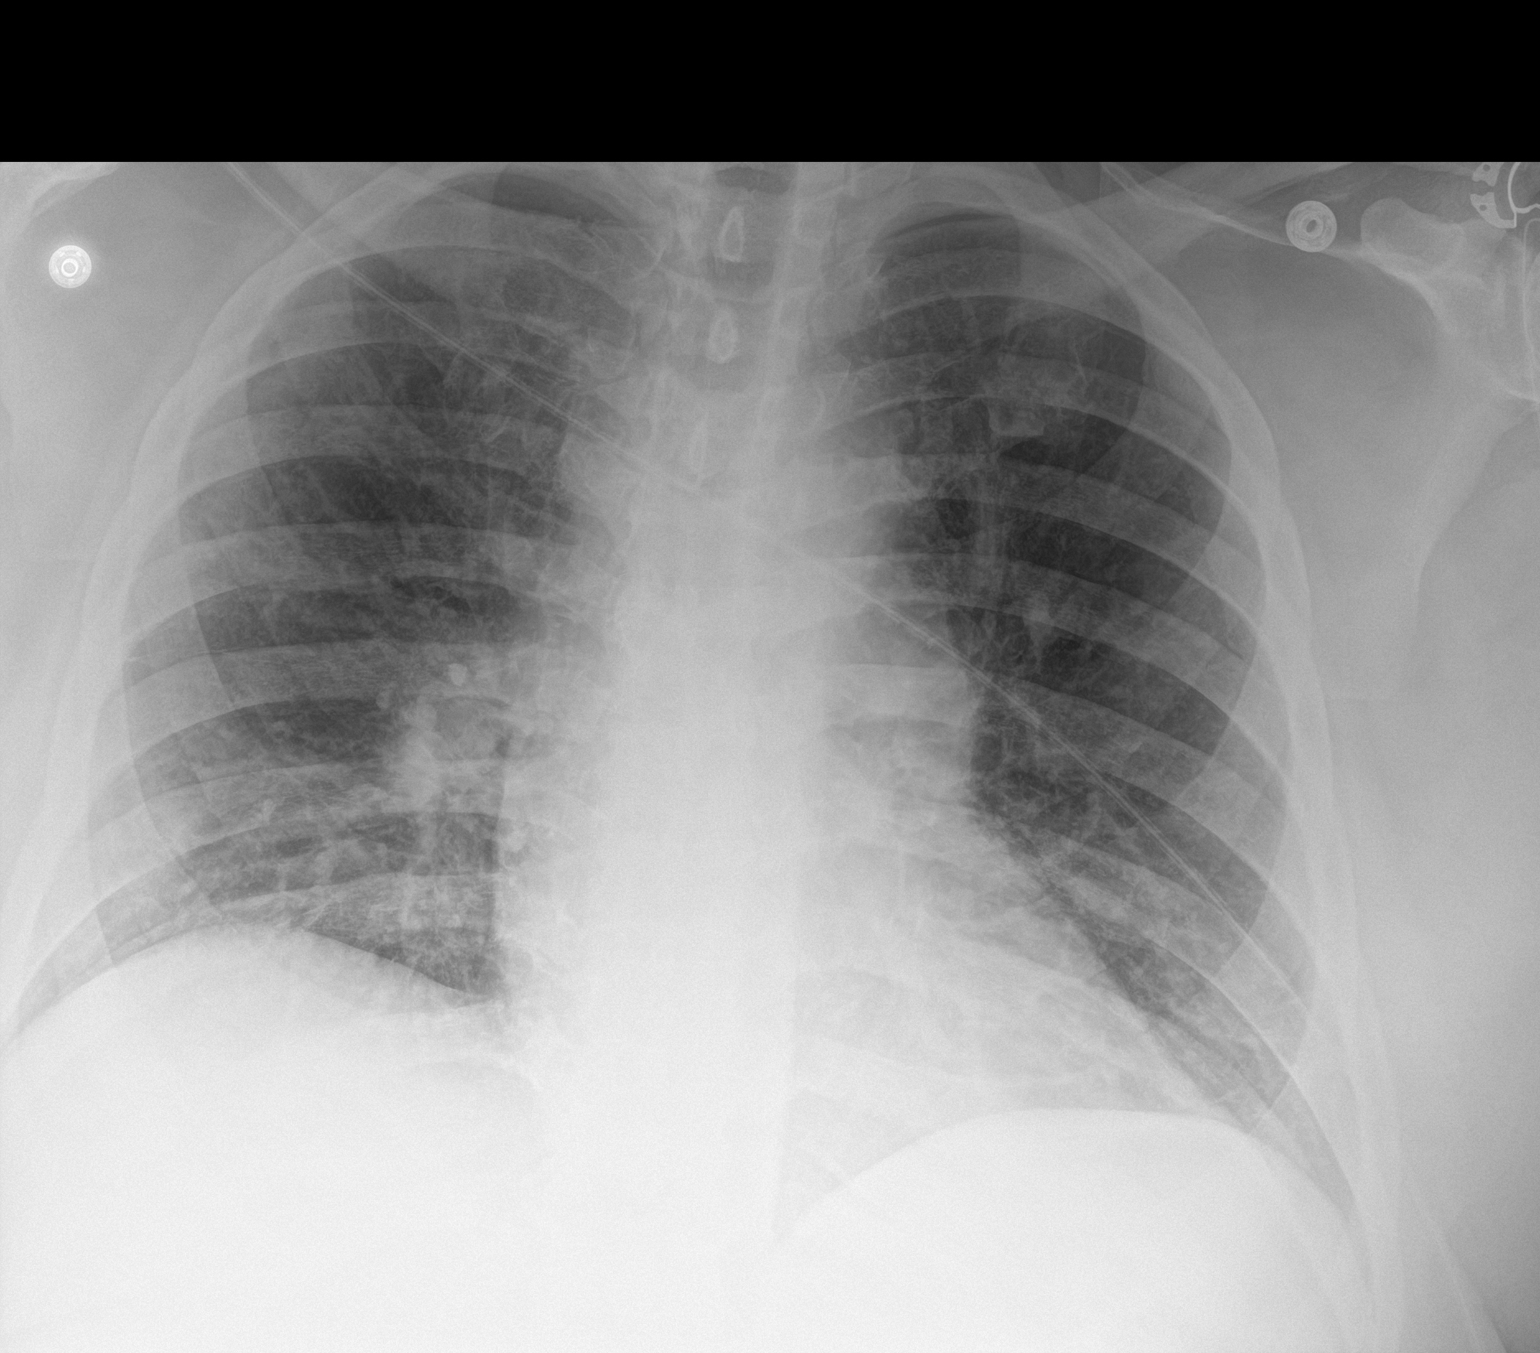

[1 of 1 positions shown; findings below may reference images not displayed]

FINDINGS: Subtle bilateral infiltrate. Normal heart size. Prominent upper
mediastinal with likely from portable technique. The hila are
negative.
IMPRESSION: Subtle bilateral infiltrate.  No significant change from prior.

## 2020-07-26 ENCOUNTER — Other Ambulatory Visit: Payer: Self-pay

## 2020-07-26 ENCOUNTER — Encounter: Payer: Self-pay | Admitting: Allergy

## 2020-07-26 ENCOUNTER — Ambulatory Visit: Payer: Commercial Managed Care - PPO | Admitting: Allergy

## 2020-07-26 VITALS — BP 140/76 | HR 73 | Temp 98.2°F | Resp 18

## 2020-07-26 DIAGNOSIS — L508 Other urticaria: Secondary | ICD-10-CM | POA: Diagnosis not present

## 2020-07-26 DIAGNOSIS — J3089 Other allergic rhinitis: Secondary | ICD-10-CM

## 2020-07-26 MED ORDER — SULFASALAZINE 500 MG PO TABS: ORAL_TABLET | ORAL | 3 refills | Status: AC

## 2020-07-26 MED ORDER — EPINEPHRINE 0.3 MG/0.3ML IJ SOAJ: 0.3000 mg | Freq: Once | INTRAMUSCULAR | 1 refills | Status: AC

## 2020-07-26 NOTE — Telephone Encounter (Signed)
Yes I have submitted his paperwork and fighting with foundation for him to get free drug. His pharmacy benefits will not pay for same and they are advising his medical side will but I have spoken to them and they wont pay either. So I am still trying to get him on drug

## 2020-07-26 NOTE — Patient Instructions (Signed)
Chronic Hives  -  at this time etiology of hives and swelling is Autoimmune in nature.  However it does appear you also have a component of pressure induced hives.  Hives can be caused by a variety of different triggers including illness/infection, foods, medications, stings, exercise, pressure, vibrations, extremes of temperature to name a few however majority of the time there is no identifiable trigger.  Your labs showed chronic urticaria index is elevated thus this shows that he makes an antibody that can target his allergy cells to produce hives. Autoimmune hives can be longer lasting and can be more difficult to treat.  Environmental allergy panel shows very low level to cockroach.  CBC, thyroid antibody studies, tryptase are unremarkable  - for management of hives continue the following high-dose antihistamine regimen: Allegra 180mg  1 tab twice a day (can take up to 4 tabs of Allegra a day if needed) with Pepcid 20mg  1 tab twice a day as well as Singulair 10mg  1 tab at bedtime.    - due to continued hive episodes recommend stepping up therapy with use of Sulfasalazine 500mg  twice a day for 1 week and if tolerated increase to 1000mg  twice a day.  This is an add-on therapy for hive control.  If hives return would start this medication while getting Xolair approved.     Allergic rhinitis  - continue avoidance measures for cockroach  - recommend use of nasal saline rinse to help keep sinuses clean/flushed out and use prior to use of medicated nasal sprays  - for control of nasal drainage use nasal antihistamine, Astelin 2 sprays each nostril twice a day as needed.  This should help to decrease nasal drainage   Follow-up in 3-4 months or sooner if needed

## 2020-07-26 NOTE — Telephone Encounter (Signed)
Thank You, I spoke with the patient and informed him.

## 2020-07-26 NOTE — Progress Notes (Addendum)
Follow-up Note  RE: Marcus Fritz MRN: [id number] DOB: May 17, 1961 Date of Office Visit: 07/26/2020   History of present illness: Marcus Fritz is a 59 y.o. male presenting today for follow-up of chronic urticaria and allergic rhinitis.  He was last seen in the office on 04/21/2019 by myself.  He states since his last visit he had 2 months of daily hives.  He states they get to the point that it is quite debilitating in difficult to work.  During this timeframe he did increase his Allegra to 3 tablets a day.  He has backed down to twice a day dosing.  He also continues to take Pepcid twice a day and singular daily.  He states the only thing that would help during his hive outbreaks is taking prednisone.  He has not been able to start Xolair at this time as he has not had approval from his insurance.  He feels like chocolate and peanuts could be contributing to the hives.  He states he did a test during the 2 months and had hives by eating chocolate and states the hives seem to worsen similarly with peanut.  He has had multiple tests of chocolate and peanut allergy testing in the past he states the last about 3 years ago.  Every time it has been tested for it has been negative. At this time he does not report any significant nasal, ocular or generalized allergy symptoms.     Review of systems: Review of Systems  Constitutional: Negative.   HENT: Negative.   Eyes: Negative.   Respiratory: Negative.   Cardiovascular: Negative.   Gastrointestinal: Negative.   Musculoskeletal: Negative.   Skin: Positive for itching and rash.  Neurological: Negative.     All other systems negative unless noted above in HPI  Past medical/social/surgical/family history have been reviewed and are unchanged unless specifically indicated below.  No changes  Medication List: Current Outpatient Medications  Medication Sig Dispense Refill  . albuterol (VENTOLIN HFA) 108 (90 Base) MCG/ACT inhaler Inhale 1-2  puffs into the lungs every 6 (six) hours as needed for wheezing or shortness of breath. 6.7 g 0  . amLODipine (NORVASC) 10 MG tablet Take 10 mg by mouth daily.    Marland Kitchen aspirin 81 MG EC tablet Take by mouth.    Marland Kitchen atorvastatin (LIPITOR) 40 MG tablet Take 40 mg by mouth daily.    . BD PEN NEEDLE NANO 2ND GEN 32G X 4 MM MISC TO BE USED WITH INSULIN PEN    . EPINEPHrine 0.3 mg/0.3 mL IJ SOAJ injection Inject 1 application into the muscle once.    . famotidine (PEPCID) 20 MG tablet Take 20 mg by mouth 2 (two) times daily.    . fexofenadine (ALLEGRA) 180 MG tablet Take 180 mg by mouth daily.    . hydrochlorothiazide (MICROZIDE) 12.5 MG capsule Take by mouth.    . insulin glargine (LANTUS) 100 UNIT/ML Solostar Pen Inject into the skin.    . Insulin Pen Needle (BD PEN NEEDLE NANO U/F) 32G X 4 MM MISC TO BE USED WITH INSULIN PEN  DX: E11.65    . LORazepam (ATIVAN) 1 MG tablet Take by mouth.    . losartan (COZAAR) 100 MG tablet Take 100 mg by mouth daily.    . montelukast (SINGULAIR) 10 MG tablet TAKE 1 TABLET(10 MG) BY MOUTH AT BEDTIME 30 tablet 5  . ONETOUCH ULTRA test strip SMARTSIG:Via Meter 1 to 2 Times Daily    . pantoprazole (PROTONIX) 40  MG tablet Take by mouth.    . tadalafil (CIALIS) 5 MG tablet TAKE 1 TABLET(5 MG) BY MOUTH DAILY AS NEEDED FOR ERECTILE DYSFUNCTION    . TRADJENTA 5 MG TABS tablet Take 5 mg by mouth daily.    Marland Kitchen azelastine (ASTELIN) 0.1 % nasal spray Place 2 sprays into both nostrils 2 (two) times daily as needed for rhinitis. Use in each nostril as directed (Patient not taking: Reported on 07/26/2020) 30 mL 5   No current facility-administered medications for this visit.     Known medication allergies: Allergies  Allergen Reactions  . Metformin Shortness Of Breath and Swelling    LIPS SWELLING AND COULDN'T BREATHE     Physical examination: Blood pressure 140/76, pulse 73, temperature 98.2 F (36.8 C), temperature source Temporal, resp. rate 18, SpO2 97 %.  General: Alert,  interactive, in no acute distress. HEENT: TMs pearly gray, turbinates non-edematous without discharge, post-pharynx non erythematous. Neck: Supple without lymphadenopathy. Lungs: Clear to auscultation without wheezing, rhonchi or rales. {no increased work of breathing. CV: Normal S1, S2 without murmurs. Abdomen: Nondistended, nontender. Skin: Warm and dry, without lesions or rashes. Extremities:  No clubbing, cyanosis or edema. Neuro:   Grossly intact.  Diagnositics/Labs: None today  Assessment and plan:   Chronic urticaria  -  at this time etiology of hives and swelling is Autoimmune in nature.  However it does appear you also have a component of pressure induced hives.  Hives can be caused by a variety of different triggers including illness/infection, foods, medications, stings, exercise, pressure, vibrations, extremes of temperature to name a few however majority of the time there is no identifiable trigger.  Your labs showed chronic urticaria index is elevated thus this shows that he makes an antibody that can target his allergy cells to produce hives. Autoimmune hives can be longer lasting and can be more difficult to treat.  Environmental allergy panel shows very low level to cockroach.  CBC, thyroid antibody studies, tryptase are unremarkable  - for management of hives continue the following high-dose antihistamine regimen: Allegra 180mg  1 tab twice a day (can take up to 4 tabs of Allegra a day if needed) with Pepcid 20mg  1 tab twice a day as well as Singulair 10mg  1 tab at bedtime.    - due to continued hive episodes recommend stepping up therapy with use of Sulfasalazine 500mg  twice a day for 1 week and if tolerated increase to 1000mg  twice a day.  This is an add-on therapy for hive control.  If hives return would start this medication while getting Xolair approved.  -I did discuss other high treatments including cyclosporine, dapsone, hydroxychloroquine including side effects and  monitoring needed -Discussed repeat testing for chocolate and peanut to ensure that he has not made IgE since his last test.  He states he was waiting on repeating this test at this time.  Allergic rhinitis  - continue avoidance measures for cockroach  - recommend use of nasal saline rinse to help keep sinuses clean/flushed out and use prior to use of medicated nasal sprays  - for control of nasal drainage use nasal antihistamine, Astelin 2 sprays each nostril twice a day as needed.  This should help to decrease nasal drainage   Follow-up in 3-4 months or sooner if needed  I appreciate the opportunity to take part in Jeramine's care. Please do not hesitate to contact me with questions.  Sincerely,   , MD Allergy/Immunology [facility] -------------------------------------------  08/21/2020: HPI addended to correct word error in Dragon recording software.  Margo Aye, MD [facility]

## 2020-07-26 NOTE — Telephone Encounter (Signed)
Patient is in for an office visit today and states that he filled out the paperwork and mailed the paperwork back to you. Did you receive it?

## 2020-07-31 NOTE — Telephone Encounter (Signed)
Called patient and advised [employer] approval for Xolair and submit to [employer] to fill same and copay card submitted

## 2020-10-26 ENCOUNTER — Ambulatory Visit: Payer: Commercial Managed Care - PPO | Admitting: Allergy

## 2020-10-26 DIAGNOSIS — J309 Allergic rhinitis, unspecified: Secondary | ICD-10-CM
